# Patient Record
Sex: Female | Born: 1985 | Race: Black or African American | Hispanic: No | Marital: Married | Smoking: Never smoker
Health system: Southern US, Community
[De-identification: ages and names within clinical notes are randomized; demographics above are authoritative.]

## PROBLEM LIST (undated history)

## (undated) DIAGNOSIS — E559 Vitamin D deficiency, unspecified: Secondary | ICD-10-CM

## (undated) DIAGNOSIS — R519 Headache, unspecified: Secondary | ICD-10-CM

## (undated) DIAGNOSIS — R51 Headache: Secondary | ICD-10-CM

## (undated) DIAGNOSIS — T7840XA Allergy, unspecified, initial encounter: Secondary | ICD-10-CM

## (undated) HISTORY — DX: Vitamin D deficiency, unspecified: E55.9

## (undated) HISTORY — DX: Allergy, unspecified, initial encounter: T78.40XA

## (undated) HISTORY — PX: NO PAST SURGERIES: SHX2092

---

## 2010-05-28 ENCOUNTER — Ambulatory Visit: Payer: Self-pay | Admitting: Family Medicine

## 2010-06-03 ENCOUNTER — Ambulatory Visit: Payer: Self-pay | Admitting: Obstetrics & Gynecology

## 2010-06-03 ENCOUNTER — Ambulatory Visit (HOSPITAL_COMMUNITY): Admission: RE | Admit: 2010-06-03 | Discharge: 2010-06-03 | Payer: Self-pay | Admitting: Family Medicine

## 2010-06-03 LAB — CONVERTED CEMR LAB
Antibody Screen: NEGATIVE
Eosinophils Absolute: 0.1 10*3/uL (ref 0.0–0.7)
Eosinophils Relative: 1 % (ref 0–5)
GC Probe Amp, Genital: NEGATIVE
HCT: 35.1 % — ABNORMAL LOW (ref 36.0–46.0)
Hemoglobin: 11.3 g/dL — ABNORMAL LOW (ref 12.0–15.0)
Hepatitis B Surface Ag: NEGATIVE
Hgb A2 Quant: 3 % (ref 2.2–3.2)
Hgb A: 97 % (ref 96.8–97.8)
Lymphocytes Relative: 15 % (ref 12–46)
Lymphs Abs: 1.3 10*3/uL (ref 0.7–4.0)
MCV: 93.9 fL (ref 78.0–100.0)
Monocytes Relative: 3 % (ref 3–12)
Pap Smear: NEGATIVE
Platelets: 203 10*3/uL (ref 150–400)
RBC: 3.74 M/uL — ABNORMAL LOW (ref 3.87–5.11)
Rh Type: POSITIVE
WBC: 8.7 10*3/uL (ref 4.0–10.5)

## 2010-06-10 ENCOUNTER — Ambulatory Visit: Payer: Self-pay | Admitting: Family Medicine

## 2010-06-19 ENCOUNTER — Ambulatory Visit: Payer: Self-pay | Admitting: Obstetrics and Gynecology

## 2010-06-26 ENCOUNTER — Ambulatory Visit: Payer: Self-pay | Admitting: Obstetrics and Gynecology

## 2010-06-26 LAB — CONVERTED CEMR LAB: Chlamydia, Swab/Urine, PCR: NEGATIVE

## 2010-07-09 ENCOUNTER — Inpatient Hospital Stay (HOSPITAL_COMMUNITY)
Admission: AD | Admit: 2010-07-09 | Discharge: 2010-07-09 | Payer: Self-pay | Source: Home / Self Care | Admitting: Obstetrics & Gynecology

## 2010-07-09 DIAGNOSIS — O9989 Other specified diseases and conditions complicating pregnancy, childbirth and the puerperium: Secondary | ICD-10-CM

## 2010-07-09 DIAGNOSIS — J329 Chronic sinusitis, unspecified: Secondary | ICD-10-CM

## 2010-07-10 ENCOUNTER — Ambulatory Visit: Payer: Self-pay | Admitting: Obstetrics and Gynecology

## 2010-07-17 ENCOUNTER — Ambulatory Visit: Payer: Self-pay | Admitting: Obstetrics and Gynecology

## 2010-07-24 ENCOUNTER — Ambulatory Visit: Payer: Self-pay | Admitting: Obstetrics and Gynecology

## 2010-07-26 ENCOUNTER — Ambulatory Visit: Payer: Self-pay | Admitting: Obstetrics and Gynecology

## 2010-07-29 ENCOUNTER — Ambulatory Visit: Payer: Self-pay | Admitting: Obstetrics and Gynecology

## 2010-07-31 ENCOUNTER — Encounter: Payer: Self-pay | Admitting: Physician Assistant

## 2010-07-31 ENCOUNTER — Ambulatory Visit: Payer: Self-pay | Admitting: Obstetrics and Gynecology

## 2010-08-02 ENCOUNTER — Ambulatory Visit (HOSPITAL_COMMUNITY)
Admission: RE | Admit: 2010-08-02 | Discharge: 2010-08-02 | Payer: Self-pay | Source: Home / Self Care | Attending: Obstetrics and Gynecology | Admitting: Obstetrics and Gynecology

## 2010-08-04 ENCOUNTER — Inpatient Hospital Stay (HOSPITAL_COMMUNITY)
Admission: AD | Admit: 2010-08-04 | Discharge: 2010-08-04 | Payer: Self-pay | Source: Home / Self Care | Attending: Obstetrics and Gynecology | Admitting: Obstetrics and Gynecology

## 2010-08-07 ENCOUNTER — Inpatient Hospital Stay (HOSPITAL_COMMUNITY)
Admission: AD | Admit: 2010-08-07 | Discharge: 2010-08-10 | Payer: Self-pay | Source: Home / Self Care | Attending: Obstetrics and Gynecology | Admitting: Obstetrics and Gynecology

## 2010-08-07 ENCOUNTER — Ambulatory Visit: Payer: Self-pay | Admitting: Obstetrics and Gynecology

## 2010-08-07 ENCOUNTER — Ambulatory Visit: Payer: Self-pay | Admitting: Obstetrics & Gynecology

## 2010-09-05 ENCOUNTER — Encounter: Payer: Self-pay | Admitting: Obstetrics and Gynecology

## 2010-09-05 ENCOUNTER — Ambulatory Visit
Admission: RE | Admit: 2010-09-05 | Discharge: 2010-09-05 | Payer: Self-pay | Source: Home / Self Care | Attending: Obstetrics and Gynecology | Admitting: Obstetrics and Gynecology

## 2010-10-21 LAB — RPR: RPR Ser Ql: NONREACTIVE

## 2010-10-21 LAB — POCT URINALYSIS DIPSTICK
Bilirubin Urine: NEGATIVE
Glucose, UA: NEGATIVE mg/dL
Hgb urine dipstick: NEGATIVE
Ketones, ur: NEGATIVE mg/dL
Nitrite: NEGATIVE

## 2010-10-21 LAB — CBC
HCT: 38 % (ref 36.0–46.0)
Hemoglobin: 12.8 g/dL (ref 12.0–15.0)
RBC: 4.3 MIL/uL (ref 3.87–5.11)

## 2010-10-22 LAB — URINALYSIS, ROUTINE W REFLEX MICROSCOPIC
Glucose, UA: NEGATIVE mg/dL
Ketones, ur: NEGATIVE mg/dL
pH: 7.5 (ref 5.0–8.0)

## 2010-10-22 LAB — POCT URINALYSIS DIPSTICK
Bilirubin Urine: NEGATIVE
Bilirubin Urine: NEGATIVE
Glucose, UA: NEGATIVE mg/dL
Glucose, UA: NEGATIVE mg/dL
Glucose, UA: NEGATIVE mg/dL
Hgb urine dipstick: NEGATIVE
Hgb urine dipstick: NEGATIVE
Hgb urine dipstick: NEGATIVE
Hgb urine dipstick: NEGATIVE
Ketones, ur: NEGATIVE mg/dL
Ketones, ur: NEGATIVE mg/dL
Ketones, ur: NEGATIVE mg/dL
Ketones, ur: NEGATIVE mg/dL
Nitrite: NEGATIVE
Protein, ur: NEGATIVE mg/dL
Specific Gravity, Urine: 1.015 (ref 1.005–1.030)
Specific Gravity, Urine: 1.02 (ref 1.005–1.030)
Specific Gravity, Urine: 1.02 (ref 1.005–1.030)
Specific Gravity, Urine: 1.025 (ref 1.005–1.030)
Urobilinogen, UA: 0.2 mg/dL (ref 0.0–1.0)
pH: 7 (ref 5.0–8.0)
pH: 7 (ref 5.0–8.0)

## 2010-10-22 LAB — GC/CHLAMYDIA PROBE AMP, GENITAL: GC Probe Amp, Genital: NEGATIVE

## 2010-10-22 LAB — WET PREP, GENITAL
Trich, Wet Prep: NONE SEEN
Yeast Wet Prep HPF POC: NONE SEEN

## 2010-10-23 LAB — POCT URINALYSIS DIPSTICK
Nitrite: NEGATIVE
Protein, ur: NEGATIVE mg/dL
Protein, ur: NEGATIVE mg/dL
Urobilinogen, UA: 0.2 mg/dL (ref 0.0–1.0)
Urobilinogen, UA: 0.2 mg/dL (ref 0.0–1.0)
pH: 6.5 (ref 5.0–8.0)

## 2010-10-28 ENCOUNTER — Ambulatory Visit: Payer: Self-pay

## 2010-10-30 ENCOUNTER — Ambulatory Visit (INDEPENDENT_AMBULATORY_CARE_PROVIDER_SITE_OTHER): Payer: Medicaid Other

## 2010-10-30 DIAGNOSIS — Z3049 Encounter for surveillance of other contraceptives: Secondary | ICD-10-CM

## 2011-01-15 ENCOUNTER — Ambulatory Visit: Payer: Medicaid Other

## 2011-02-19 NOTE — Progress Notes (Signed)
Wanda Norton, BACHMANN NO.:  [id number]  MEDICAL RECORD NO.:  [id number]           PATIENT TYPE:  LOCATION:  [facility]                     FACILITY:  PHYSICIAN:  Caren Griffins, CNM       DATE OF BIRTH:  [birth year]  DATE OF SERVICE:  09/05/2010                                 CLINIC NOTE  REASON FOR VISIT:  Postpartum check.  HISTORY:  This is a 25 year old G2, P2-0-0-2 who is now 4 weeks post NSVD of a female weighing 8 pounds 5 ounces with no complications other than fourth-degree laceration.  She is doing well and has no concerns. She does not know whether she got the Depo-Provera shot, which was her preferred method of contraception.  She is breast-feeding without difficulty.  The baby is thriving and she has an appointment with the pediatrician January 31.  She has had intercourse without problems, there is no pain from the first-degree laceration and repair.  She denies depression and in fact has a score of only 1 on Edinburgh postpartum depression scale.  She would like to follow up here for the Depo-Provera.  Of note, the language line is used since she only speaks Arabic.  OBJECTIVE:  VITAL SIGNS:  Temperature 97.7, pulse 73, BP 104/66, weight 144.3, height 5 feet 3 inches. GENERAL:  WN/WD in NAD. NECK:  Thyroid not enlarged, smooth. LUNGS:  CTA bilateral. HEART:  RRR without murmur. BREASTS:  Lactational, not inflamed. ABDOMEN:  Soft, flat, nontender, small diastasis. PELVIC:  NEFG.  There is about 2 mm length of suture visible.  The first grade midline laceration is healed though.  There is a small amount of dark brown blood.  Cervix is long, closed, high.  Uterus is well involuted nontender.  No adnexal tenderness or masses. EXTREMITIES:  Without edema.  ASSESSMENT AND PLAN:  Normal 4 weeks postpartum status post Depo- Provera.  I explained she may have some abnormal bleeding or spotting and that she is protected from pregnancy.  She is given an  appointment to return here on November 10, 2010, for her next Depo-Provera shot, and she will need a Pap in October 2012.          ______________________________ Caren Griffins, CNM    DP/MEDQ  D:  09/05/2010  T:  09/06/2010  Job:  [phone]

## 2011-03-19 ENCOUNTER — Ambulatory Visit: Payer: Medicaid Other | Admitting: Family

## 2011-03-20 ENCOUNTER — Ambulatory Visit (INDEPENDENT_AMBULATORY_CARE_PROVIDER_SITE_OTHER): Payer: Medicaid Other | Admitting: Family Medicine

## 2011-03-20 VITALS — BP 103/69 | HR 69 | Wt 158.0 lb

## 2011-03-20 DIAGNOSIS — Z3049 Encounter for surveillance of other contraceptives: Secondary | ICD-10-CM

## 2011-03-20 DIAGNOSIS — Z309 Encounter for contraceptive management, unspecified: Secondary | ICD-10-CM

## 2011-03-20 LAB — POCT PREGNANCY, URINE: Preg Test, Ur: NEGATIVE

## 2011-03-20 MED ORDER — MEDROXYPROGESTERONE ACETATE 150 MG/ML IM SUSP
150.0000 mg | Freq: Once | INTRAMUSCULAR | Status: AC
  Administered 2011-03-20: 150 mg via INTRAMUSCULAR

## 2011-03-20 NOTE — Progress Notes (Signed)
Pt with sparse depo contraception.  Pt desires to resume depo with her last depo being more than 90 days ago.  Will check UPT.  She has intermittent menses so is unsure of LMP.  If UPT neg, will give depo. Case discussed with RN.    Reda Citron 2:20 PM 03/20/2011

## 2011-08-07 ENCOUNTER — Ambulatory Visit (INDEPENDENT_AMBULATORY_CARE_PROVIDER_SITE_OTHER): Payer: Medicaid Other | Admitting: Family Medicine

## 2011-08-07 VITALS — BP 100/64 | HR 81 | Temp 98.0°F | Ht 63.0 in | Wt 150.2 lb

## 2011-08-07 DIAGNOSIS — Z309 Encounter for contraceptive management, unspecified: Secondary | ICD-10-CM | POA: Insufficient documentation

## 2011-08-07 DIAGNOSIS — Z3049 Encounter for surveillance of other contraceptives: Secondary | ICD-10-CM

## 2011-08-07 DIAGNOSIS — Z23 Encounter for immunization: Secondary | ICD-10-CM

## 2011-08-07 MED ORDER — MEDROXYPROGESTERONE ACETATE 150 MG/ML IM SUSP
150.0000 mg | Freq: Once | INTRAMUSCULAR | Status: AC
  Administered 2011-08-07: 150 mg via INTRAMUSCULAR

## 2011-08-07 MED ORDER — MEDROXYPROGESTERONE ACETATE 150 MG/ML IM SUSP: 150.0000 mg | INTRAMUSCULAR | Status: DC

## 2011-08-07 MED ORDER — INFLUENZA VIRUS VACC SPLIT PF IM SUSP
0.5000 mL | Freq: Once | INTRAMUSCULAR | Status: AC
  Administered 2011-08-07: 0.5 mL via INTRAMUSCULAR

## 2011-08-07 NOTE — Progress Notes (Signed)
  Subjective:    Patient ID: Wanda Norton, female    DOB: 05-Apr-1986, 25 y.o.   MRN: [id number]  HPI Patient seen for contraception management.  LMP 12/20.  Normal cycles.  Has used depo in past.  Would like to do something longer.  Denies dysmenorrhea, pelvic pain.     Review of Systems     Objective:   Physical Exam  Constitutional: She appears well-developed and well-nourished.  Psychiatric: She has a normal mood and affect. Her behavior is normal. Judgment and thought content normal.      Assessment & Plan:  1.  Contraception management.   Urine pregnancy test done.  Depo provera shot.   Discussed mirena IUD. Patient would like to have this inserted.  Will schedule patient 2 months for IUD placement.

## 2011-10-08 ENCOUNTER — Encounter: Payer: Self-pay | Admitting: Obstetrics & Gynecology

## 2011-10-08 ENCOUNTER — Ambulatory Visit (INDEPENDENT_AMBULATORY_CARE_PROVIDER_SITE_OTHER): Payer: Medicaid Other | Admitting: Obstetrics & Gynecology

## 2011-10-08 VITALS — BP 100/62 | HR 85 | Temp 98.3°F | Resp 16 | Ht 62.5 in | Wt 148.2 lb

## 2011-10-08 DIAGNOSIS — Z01812 Encounter for preprocedural laboratory examination: Secondary | ICD-10-CM

## 2011-10-08 DIAGNOSIS — Z30430 Encounter for insertion of intrauterine contraceptive device: Secondary | ICD-10-CM | POA: Insufficient documentation

## 2011-10-08 DIAGNOSIS — Z309 Encounter for contraceptive management, unspecified: Secondary | ICD-10-CM

## 2011-10-08 MED ORDER — LEVONORGESTREL 20 MCG/24HR IU IUD
INTRAUTERINE_SYSTEM | Freq: Once | INTRAUTERINE | Status: AC
  Administered 2011-10-08: 1 via INTRAUTERINE

## 2011-10-08 NOTE — Patient Instructions (Signed)
Intrauterine Device Insertion Care After Refer to this sheet in the next few weeks. These instructions provide you with information on caring for yourself after your procedure. Your caregiver may also give you more specific instructions. Your treatment has been planned according to current medical practices, but problems sometimes occur. Call your caregiver if you have any problems or questions after your procedure. HOME CARE INSTRUCTIONS   Only take over-the-counter or prescription medicines for pain, discomfort, or fever as directed by your caregiver. Do not use aspirin. This may increase bleeding.   Check your IUD to make sure it is in place before you resume sexual activity. You should be able to feel the strings. If you cannot feel the strings, something may be wrong. The IUD may have fallen out of the uterus, or the uterus may have been punctured (perforated) during placement. Also, if the strings are getting longer, it may mean that the IUD is being forced out of the uterus. You no longer have full protection from pregnancy if any of these problems occur.   You may resume sexual intercourse if you are not having problems with the IUD. The IUD is considered immediately effective.   You may resume normal activities.   Keep all follow-up appointments to be sure your IUD has remained in place. After the first exam, yearly exams are advised, unless you cannot feel the strings of your IUD.   Continue to check that the IUD is still in place by feeling for the strings after every menstrual period.  SEEK MEDICAL CARE IF:   You have bleeding that is heavier or lasts longer than a normal menstrual cycle.   You have a fever.   You have increasing cramps or abdominal pain not relieved with medicine.   You have abdominal pain that does not seem to be related to the same area of earlier cramping and pain.   You are lightheaded, unusually weak, or faint.   You have abnormal vaginal discharge or  smells.   You have pain during sexual intercourse.   You cannot feel the IUD strings, or the IUD string has gotten longer.   You feel the IUD at the opening of the cervix in the vagina.   You think you are pregnant, or you miss your menstrual period.   The IUD string is hurting your sex partner.  Document Released: 03/26/2011 Document Revised: 04/09/2011 Document Reviewed: 03/26/2011 [employer] Patient Information 2012 [employer]. 

## 2011-10-08 NOTE — Progress Notes (Signed)
  Subjective:    Patient ID: Wanda Norton, female    DOB: November 19, 1985, 26 y.o.   MRN: [id number]  HPI [id number] Patient's last menstrual period was 07/31/2011. Patient is scheduled for insertion of Mirena today. She was counseled at her last visit and she received a Depo-Provera injection. Using the interpreter over the phone we discussed the methods of contraception the risk of failure the risk of bleeding infection pain and uterine damage. She signed consent. Her questions were answered. History reviewed. No pertinent past medical history. History reviewed. No pertinent past surgical history. Current outpatient prescriptions:ibuprofen (ADVIL) 200 MG tablet, Take 200 mg by mouth every 6 (six) hours as needed.  , Disp: , Rfl:  No Known Allergies History   Social History  . Marital Status: Married    Spouse Name: N/A    Number of Children: N/A  . Years of Education: N/A   Occupational History  . Not on file.   Social History Main Topics  . Smoking status: Never Smoker   . Smokeless tobacco: Never Used  . Alcohol Use: No  . Drug Use: No  . Sexually Active: Yes    Birth Control/ Protection: None, Injection   Other Topics Concern  . Not on file   Social History Narrative  . No narrative on file      Review of Systems No bleeding or abnormal discharge.    Objective:   Physical Exam Filed Vitals:   10/08/11 1437  BP: 100/62  Pulse: 85  Temp: 98.3 F (36.8 C)  TempSrc: Oral  Resp: 16  Height: 5' 2.5" (1.588 m)  Weight: 148 lb 3.2 oz (67.223 kg)   No acute distress normal affect Pelvic: External genitalia vagina and cervix appeared normal. Cervix was prepped with Betadine. Uterus sounded to 7 cm. The Mirena was placed in the usual fashion with no difficulty. The string was trimmed to about 3 cm. Instruments were removed. She tolerated this well without problems.       Assessment & Plan:  Mirena insertion without complications. She will notify us if she has problems  obtained abnormal bleeding abnormal discharge or fever. She'll return in 4 weeks for string check.  Dr. Scheryl Darter 10/08/2011

## 2011-10-08 NOTE — Progress Notes (Signed)
UPT = negative   Pt informed of IUD placement procedure and had no questions. Pt states she was informed of risks @ last visit.  [employer] interpreter # [phone] used during nurse assessment.

## 2011-10-29 ENCOUNTER — Encounter: Payer: Self-pay | Admitting: Advanced Practice Midwife

## 2011-10-29 ENCOUNTER — Ambulatory Visit (INDEPENDENT_AMBULATORY_CARE_PROVIDER_SITE_OTHER): Payer: Medicaid Other | Admitting: Advanced Practice Midwife

## 2011-10-29 VITALS — BP 95/63 | HR 65 | Temp 97.2°F | Ht 62.0 in | Wt 144.7 lb

## 2011-10-29 DIAGNOSIS — Z30431 Encounter for routine checking of intrauterine contraceptive device: Secondary | ICD-10-CM

## 2011-10-29 DIAGNOSIS — Z30430 Encounter for insertion of intrauterine contraceptive device: Secondary | ICD-10-CM

## 2011-10-29 NOTE — Progress Notes (Signed)
  Subjective:    Patient ID: Wanda Norton, female    DOB: [birth year], 26 y.o.   MRN: [id number]  HPI This is a 26 y.o. who presents for string check. She had a Mirena IUD inserted 10/08/11 without incident. She has had no problems except for some mild back pain, lower.    Review of Systems As above    Objective:   Physical Exam Speculum exam:  Normal ext. Genitalia                              Vagina clear                               Cervix closed, IUD string visible.        Assessment & Plan:  Reassured patient Continue normal activities at home.  Return to office PRN

## 2011-10-29 NOTE — Patient Instructions (Signed)

## 2011-12-15 ENCOUNTER — Encounter (HOSPITAL_COMMUNITY): Payer: Self-pay | Admitting: *Deleted

## 2011-12-15 ENCOUNTER — Emergency Department (HOSPITAL_COMMUNITY): Payer: Medicaid Other

## 2011-12-15 ENCOUNTER — Emergency Department (HOSPITAL_COMMUNITY)
Admission: EM | Admit: 2011-12-15 | Discharge: 2011-12-16 | Disposition: A | Discharge status: Home / Self Care | Payer: Medicaid Other | Attending: Emergency Medicine | Admitting: Emergency Medicine

## 2011-12-15 DIAGNOSIS — R1013 Epigastric pain: Secondary | ICD-10-CM | POA: Insufficient documentation

## 2011-12-15 DIAGNOSIS — R799 Abnormal finding of blood chemistry, unspecified: Secondary | ICD-10-CM | POA: Insufficient documentation

## 2011-12-15 DIAGNOSIS — R9431 Abnormal electrocardiogram [ECG] [EKG]: Secondary | ICD-10-CM | POA: Insufficient documentation

## 2011-12-15 DIAGNOSIS — R079 Chest pain, unspecified: Secondary | ICD-10-CM | POA: Insufficient documentation

## 2011-12-15 DIAGNOSIS — R0602 Shortness of breath: Secondary | ICD-10-CM | POA: Insufficient documentation

## 2011-12-15 DIAGNOSIS — R002 Palpitations: Secondary | ICD-10-CM | POA: Insufficient documentation

## 2011-12-15 LAB — CBC
Hemoglobin: 13 g/dL (ref 12.0–15.0)
MCHC: 33.4 g/dL (ref 30.0–36.0)
Platelets: 247 10*3/uL (ref 150–400)
RBC: 4.55 MIL/uL (ref 3.87–5.11)

## 2011-12-15 LAB — BASIC METABOLIC PANEL
CO2: 23 mEq/L (ref 19–32)
GFR calc non Af Amer: >90 mL/min (ref >90)
Glucose, Bld: 111 mg/dL — ABNORMAL HIGH (ref 70–99)
Potassium: 3.4 mEq/L — ABNORMAL LOW (ref 3.5–5.1)
Sodium: 142 mEq/L (ref 135–145)

## 2011-12-15 LAB — D-DIMER, QUANTITATIVE: D-Dimer, Quant: 0.53 ug/mL-FEU — ABNORMAL HIGH (ref 0.00–0.48)

## 2011-12-15 LAB — PRO B NATRIURETIC PEPTIDE: Pro B Natriuretic peptide (BNP): 22 pg/mL (ref 0–125)

## 2011-12-15 MED ORDER — IOHEXOL 350 MG/ML SOLN
80.0000 mL | Freq: Once | INTRAVENOUS | Status: AC | PRN
  Administered 2011-12-15: 80 mL via INTRAVENOUS

## 2011-12-15 MED ORDER — ASPIRIN 325 MG PO TABS
325.0000 mg | ORAL_TABLET | ORAL | Status: AC
  Administered 2011-12-15: 325 mg via ORAL
  Filled 2011-12-15: qty 1

## 2011-12-15 MED ORDER — NITROGLYCERIN 0.4 MG SL SUBL: 0.4000 mg | SUBLINGUAL_TABLET | SUBLINGUAL | Status: DC | PRN

## 2011-12-15 NOTE — ED Notes (Signed)
Pt with epigastric pain that began yesterday.  Pt states that she has been feeling tired and had some sob with this.  Pt could not sleep due to symptoms and she states that she has been having a lot of stress

## 2011-12-15 NOTE — ED Notes (Signed)
Patient denies pain and is resting comfortably.  

## 2011-12-15 NOTE — ED Notes (Signed)
Pt is resting comfortably and reports she is chest pain free.

## 2011-12-15 NOTE — Discharge Instructions (Signed)
You will need to follow up with cardiology for evaluation for possible Wolff-Parkinson-White syndrome and possible Holter monitor.  Palpitations  A palpitation is the feeling that your heartbeat is irregular or is faster than normal. Although this is frightening, it usually is not serious. Palpitations may be caused by excesses of smoking, caffeine, or alcohol. They are also brought on by stress and anxiety. Sometimes, they are caused by heart disease. Unless otherwise noted, your caregiver did not find any signs of serious illness at this time. HOME CARE INSTRUCTIONS  To help prevent palpitations:  Drink decaffeinated coffee, tea, and soda pop. Avoid chocolate.   If you smoke or drink alcohol, quit or cut down as much as possible.   Reduce your stress or anxiety level. Biofeedback, yoga, or meditation will help you relax. Physical activity such as swimming, jogging, or walking also may be helpful.  SEEK MEDICAL CARE IF:   You continue to have a fast heartbeat.   Your palpitations occur more often.  SEEK IMMEDIATE MEDICAL CARE IF: You develop chest pain, shortness of breath, severe headache, dizziness, or fainting. Document Released: 07/25/2000 Document Revised: 07/17/2011 Document Reviewed: 09/24/2007 [facility] Patient Information 2012 [place], [place].  Wolff-Parkinson-White Syndrome Wolff-Parkinson-White Syndrome (WPW) is an abnormal heart condition that can cause the heart to beat very fast. CAUSES  In WPW syndrome, an extra electrical connection (pathway) exists between the top chambers of your heart (atria) and the bottom chambers of your heart (ventricles). This is known as an extra (accessory) pathway. This extra pathway can cause the heart to short circuit and beat very fast. SYMPTOMS  Symptoms in WPW syndrome can vary. These include:  Feeling your heart "skip" beats (palpitations).   Dizziness.   Fainting or near fainting.   Sudden death.  DIAGNOSIS  WPW can be diagnosed  by different test such as:  ECG (electrocardiogram).   Echocardiogram.   Holter monitoring.   Stress testing.   Electrophysiology study.   Laboratory tests that check certain blood levels.   Thyroid study.  TREATMENT  WPW is usually treated in two ways:  Radiofrequency destruction (ablation). In this procedure, a thin, flexible tube (catheter) is placed in the heart through a vein in the upper leg (groin). The catheter is guided to the extra pathway. The extra pathway is destroyed by using high frequency radio waves.   Medicine can sometimes be used to treat WPW.  SEEK IMMEDIATE MEDICAL CARE IF:   You feel palpitations that are frequent or continual.   You develop chest pain and also have:   Shortness of breath or difficulty breathing.   Nausea and vomiting.   Sweating.   You become light-headed or faint (pass out).  MAKE SURE YOU:   Understand these instructions.   Will watch your condition.   Will get help right away if you are not doing well or get worse.  Document Released: 10/18/2003 Document Revised: 07/17/2011 Document Reviewed: 10/14/2008 [facility] Patient Information 2012 [place], [place].   RESOURCE GUIDE  Dental Problems  Patients with Medicaid: [facility] [address]                                           [phone] [address] [employer] [address] Phone:  [phone]  Phone:  [phone]  If unable to pay or uninsured, contact:  [facility] or [facility]. to become qualified for the adult dental clinic.  Chronic Pain Problems Contact [facility]  [phone] Patients need to be referred by their primary care doctor.  Insufficient Money for Medicine Contact [employer]:  call "211" or [facility] [phone].  No Primary Care Doctor Call [facility]  [phone] Other agencies that provide inexpensive medical care     [facility]  [phone]    [facility] Internal Medicine  [phone]    [facility]  [phone]    [facility]  [phone]    [facility]  [phone]    [facility]  [phone]  Psychological Services [facility] Behavioral Health  [phone] [facility]  [phone] [facility] Mental Health   [phone] (emergency services [phone])  Abuse/Neglect [facility] Child Abuse Hotline (559)723-4851 [facility] Child Abuse Hotline (878)527-4838 (After Hours)  Emergency Shelter [facility] 5813191070  Maternity Homes [facility] at the [place] [facility] [place] [phone] [place] Alert Services 5091902197  MRSA Hotline #:   (506)824-4957    [facility] Resources  [facility]  [employer]                           [facility] Dept. [address] [place]                     [address]         [address]  Blondell Reveal Phone:  [phone]                                  Phone:  [phone]                   Phone:  [phone]  [facility] Mental Health Phone:  [phone]  [facility] Child Abuse Hotline [phone]) 575-9640 [phone] (After Hours)

## 2011-12-16 NOTE — ED Provider Notes (Addendum)
History     CSN: [id number]  Arrival date & time 12/15/11  1738   First MD Initiated Contact with Patient 12/15/11 2011      Chief Complaint  Patient presents with  . Shortness of Breath  . Palpitations    (Consider location/radiation/quality/duration/timing/severity/associated sxs/prior treatment) HPI  Language interpreter used for translation.   26yoF is a healthy presents with shortness of breath and palpitations. The patient states that since yesterday approximately 1 AM she began to intermittent palpitations with mild shortness of breath. She's not having shortness of breath at this time. She states that yesterday she had minimal epigastric pain but denies chest pain. Shortness of breath and palpitations were not worse with exertion. Denies h/o VTE in self or family. No recent hosp/surg/immob. No h/o cancer. Denies exogenous hormone use, no leg pain or swelling. No h/o similar. She does state that she had some burning in her feet with palpitations as well. She denies numbness or tingling in her fingers. She does state that she's had recent increase in social stressors. No history of anxiety in the past. No syncope. Denies fever/chills/recent illness.   ED Notes, ED Provider Notes from 12/15/11 0000 to 12/15/11 19:18:55       Threasa Alpha Flueckiger, RN 12/15/2011 17:46      Pt with epigastric pain that began yesterday. Pt states that she has been feeling tired and had some sob with this. Pt could not sleep due to symptoms and she states that she has been having a lot of stress     History reviewed. No pertinent past medical history.  History reviewed. No pertinent past surgical history.  No family history on file.  History  Substance Use Topics  . Smoking status: Never Smoker   . Smokeless tobacco: Never Used  . Alcohol Use: No    OB History    Grav Para Term Preterm Abortions TAB SAB Ect Mult Living   2 2 2       2       Review of Systems  All other systems reviewed  and are negative.   except as noted HPI  Allergies  Review of patient's allergies indicates no known allergies.  Home Medications   Current Outpatient Rx  Name Route Sig Dispense Refill  . IBUPROFEN 200 MG PO TABS Oral Take 200 mg by mouth every 6 (six) hours as needed. FOR PAIN      BP 96/50  Pulse 69  Temp(Src) 98.7 F (37.1 C) (Oral)  Resp 16  SpO2 98%  LMP 12/14/2011  Physical Exam  Nursing note and vitals reviewed. Constitutional: She is oriented to person, place, and time. She appears well-developed.  HENT:  Head: Atraumatic.  Mouth/Throat: Oropharynx is clear and moist.  Eyes: Conjunctivae and EOM are normal. Pupils are equal, round, and reactive to light.  Neck: Normal range of motion. Neck supple.  Cardiovascular: Normal rate, regular rhythm, normal heart sounds and intact distal pulses.   Pulmonary/Chest: Effort normal and breath sounds normal. No respiratory distress. She has no wheezes. She has no rales.  Abdominal: Soft. She exhibits no distension. There is no tenderness. There is no rebound and no guarding.  Musculoskeletal: Normal range of motion. She exhibits no edema and no tenderness.  Neurological: She is alert and oriented to person, place, and time.  Skin: Skin is warm and dry. No rash noted.  Psychiatric: She has a normal mood and affect.    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  CHEST - 2 VIEW  Comparison: None.  Findings:  Cardiopericardial silhouette within normal limits. Mediastinal contours normal. Trachea midline.  No airspace disease or effusion.  IMPRESSION: No active cardiopulmonary disease.  Original Report Authenticated By: Andreas Newport, M.D.   Ct Angio Chest W/cm &/or Wo Cm  12/15/2011  *RADIOLOGY REPORT*  Clinical Data: Chest pressure and shortness of breath; elevated D- dimer.  CT ANGIOGRAPHY CHEST  Technique:  Multidetector CT imaging of the chest using the standard protocol during bolus administration of intravenous contrast. Multiplanar reconstructed images including MIPs were obtained and reviewed to evaluate the vascular anatomy.  Contrast: 80mL OMNIPAQUE IOHEXOL 350 MG/ML SOLN  Comparison: Chest radiograph performed earlier today at 06:25 p.m.  Findings: There is no evidence of significant pulmonary embolus.  The lungs are essentially clear bilaterally.  There is no evidence of significant focal consolidation, pleural effusion or pneumothorax.  No masses are identified; no abnormal focal contrast enhancement is seen.  The mediastinum is unremarkable in appearance.  There is no evidence of mediastinal lymphadenopathy.  No pericardial effusion is seen.  The great vessels are unremarkable in appearance.  No axillary lymphadenopathy is seen.  The visualized portions of the thyroid gland are unremarkable in appearance.  The visualized portions of the liver and spleen are unremarkable.  No acute osseous abnormalities are seen.  IMPRESSION: No evidence of significant pulmonary embolus; unremarkable CTA of the chest.  Original Report Authenticated By: Tonia Ghent, M.D.   1. Shortness of breath   2. Palpitations   3. Shortened PR interval    MDM  26yoF previously healthy pw intermittent  palpitations. 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 She is not currently having arrhythmia. No syncope. Discussed briefly with cardiology on call. Appropriate for outpatient follow up. No new medications at this time. Discussed with patient and family who will f/u. Dimer positive, CTA negative for PE. I do not suspect infectious etiology, in fact, she does not complain of chest pain to me- endo/myo/pericarditis less likely. Given strict precautions for return.    BP 108/61  Pulse 68  Temp(Src) 98.7 F (37.1 C) (Oral)  Resp 16  SpO2 99%  LMP 12/14/2011     Forbes Cellar, MD 12/16/11 [phone]

## 2013-12-01 ENCOUNTER — Ambulatory Visit (INDEPENDENT_AMBULATORY_CARE_PROVIDER_SITE_OTHER): Payer: No Typology Code available for payment source | Admitting: Obstetrics & Gynecology

## 2013-12-01 ENCOUNTER — Encounter: Payer: Self-pay | Admitting: Obstetrics & Gynecology

## 2013-12-01 VITALS — BP 113/73 | HR 76 | Temp 98.0°F | Ht 62.0 in | Wt 152.8 lb

## 2013-12-01 DIAGNOSIS — Z30432 Encounter for removal of intrauterine contraceptive device: Secondary | ICD-10-CM

## 2013-12-01 NOTE — Progress Notes (Signed)
Patient ID: Wanda Norton, female   DOB: 17-Sep-1985, 28 y.o.   MRN: [id number] The patient c/o acne and HA that is continuing after 2 1/2 years after an IUD so she is seeking to have it removed.  She reports that she will use [employer].  Patient was in the dorsal lithotomy position, normal external genitalia was noted.  A speculum was placed in the patient's vagina, normal discharge was noted, no lesions. The multiparous cervix was visualized, no lesions, no abnormal discharge;  and the cervix was swabbed with Betadine using scopettes. The strings of the IUD were grasped and pulled using ring forceps.  The IUD was successfully removed in its entirety. Patient tolerated the procedure well.

## 2013-12-01 NOTE — Patient Instructions (Signed)
Natural Family Planning  Natural Family Planning (NFP) is a type of birth control without using any form of contraception. Women who use NFP should not have sexual intercourse when the ovary produces an egg (ovulation) during the menstrual cycle. The NFP method is safe and can prevent pregnancy. It is 75% effective when practiced right. The man needs to also understand this method of birth control and the woman needs to be aware of how her body functions during her menstrual cycle. NFP can also be used as a method of getting pregnant.   HOW THE NFP METHOD WORKS  · A woman's menstrual period usually happens every 28 30 days (it can vary from 23 35 days).  · Ovulation happens 12 14 days before the start of the next menstrual period (the fertile period). The egg is fertile for 24 hours and the sperm can live for 3 days or more. If there is sexual intercourse at this time, pregnancy can occur.  THERE ARE MANY TYPES OF NFP METHODS USED TO PREVENT PREGNANCY  · The basal body temperature method Often times, there is a slight increase of body temperature when a woman ovulates. Take your temperature every morning before getting out of bed. Write the temperature on a chart. An increase in the temperature shows ovulation has happened. Do not have sexual intercourse from the menstrual period up to three days after the increase in the temperature. Note that the body temperature may increase as a result of fever, restless sleep, and working schedules.  · The ovulation cervical mucus method During the menstrual cycle, the cervical mucus changes from dry and sticky to wet and slippery. Check the mucus of the vagina every day to look for these changes. Just before ovulation, the mucus becomes wet and slippery. On the last day of wetness, ovulation happens. To avoid getting pregnant, sexual intercourse is safe for about 10 days after the menstrual period and on the dry mucus days. Do not have sexual intercourse when the mucus starts  to show up and not until 4 days after the wet and slippery mucus goes away. Sexual intercourse after the 4 days have passed until the menstrual period starts is a safe time. Note that the mucus from the vagina can increase because of a vaginal or cervical infection, lubricants, some medicines, and sexual excitement.  · The symptothermal method This method uses both the temperature and the ovulation methods. Combine the two methods above to prevent pregnancy.  · The calendar method Record your menstrual periods and length of the cycles for 6 months. This is helpful when the menstrual cycle varies in the length of the cycle. The length of a menstrual cycle is from day 1 of the present menstrual period to day 1 of the next menstrual period. Then, find your fertile days of the month and do not have sexual intercourse during that time. You may need help from your health care provider to find out your fertile days.  There are some signs of ovulation that may be helpful when trying to find the time of ovulation. This includes vaginal spotting or abdominal cramps during the middle of your menstrual cycle. Not all women have these symptoms.  YOU SHOULD NOT USE NFP IF:  · You have very irregular menstrual periods and may skip months.  · You have abnormal bleeding.  · You have a vaginal or cervical infection.  · You are on medicines that can affect the vaginal mucus or body temperature. These medicines include   antibiotics, thyroid medicines, and antihistamines (cold and allergy medicine).  Document Released: 01/14/2008 Document Revised: 03/30/2013 Document Reviewed: 01/28/2013  ExitCare® Patient Information ©2014 [employer].

## 2013-12-05 ENCOUNTER — Encounter: Payer: Self-pay | Admitting: *Deleted

## 2014-06-12 ENCOUNTER — Encounter: Payer: Self-pay | Admitting: Obstetrics & Gynecology

## 2014-06-30 ENCOUNTER — Emergency Department (HOSPITAL_COMMUNITY): Payer: No Typology Code available for payment source

## 2014-06-30 ENCOUNTER — Emergency Department (HOSPITAL_COMMUNITY)
Admission: EM | Admit: 2014-06-30 | Discharge: 2014-07-01 | Disposition: A | Discharge status: Home / Self Care | Payer: No Typology Code available for payment source | Attending: Emergency Medicine | Admitting: Emergency Medicine

## 2014-06-30 ENCOUNTER — Encounter (HOSPITAL_COMMUNITY): Payer: Self-pay | Admitting: Emergency Medicine

## 2014-06-30 DIAGNOSIS — R1110 Vomiting, unspecified: Secondary | ICD-10-CM

## 2014-06-30 DIAGNOSIS — Z791 Long term (current) use of non-steroidal anti-inflammatories (NSAID): Secondary | ICD-10-CM | POA: Insufficient documentation

## 2014-06-30 DIAGNOSIS — Z3202 Encounter for pregnancy test, result negative: Secondary | ICD-10-CM | POA: Insufficient documentation

## 2014-06-30 DIAGNOSIS — R112 Nausea with vomiting, unspecified: Secondary | ICD-10-CM | POA: Insufficient documentation

## 2014-06-30 LAB — CBC WITH DIFFERENTIAL/PLATELET
Basophils Absolute: 0 10*3/uL (ref 0.0–0.1)
Basophils Relative: 0 % (ref 0–1)
EOS ABS: 0.1 10*3/uL (ref 0.0–0.7)
EOS PCT: 1 % (ref 0–5)
HCT: 37.8 % (ref 36.0–46.0)
HEMOGLOBIN: 12.5 g/dL (ref 12.0–15.0)
LYMPHS ABS: 1.1 10*3/uL (ref 0.7–4.0)
Lymphocytes Relative: 14 % (ref 12–46)
MCH: 28.2 pg (ref 26.0–34.0)
MCHC: 33.1 g/dL (ref 30.0–36.0)
MCV: 85.1 fL (ref 78.0–100.0)
MONOS PCT: 4 % (ref 3–12)
Monocytes Absolute: 0.3 10*3/uL (ref 0.1–1.0)
Neutro Abs: 6.4 10*3/uL (ref 1.7–7.7)
Neutrophils Relative %: 81 % — ABNORMAL HIGH (ref 43–77)
PLATELETS: 219 10*3/uL (ref 150–400)
RBC: 4.44 MIL/uL (ref 3.87–5.11)
RDW: 13.2 % (ref 11.5–15.5)
WBC: 7.8 10*3/uL (ref 4.0–10.5)

## 2014-06-30 LAB — URINALYSIS, ROUTINE W REFLEX MICROSCOPIC
BILIRUBIN URINE: NEGATIVE
Glucose, UA: NEGATIVE mg/dL
Hgb urine dipstick: NEGATIVE
Ketones, ur: NEGATIVE mg/dL
NITRITE: NEGATIVE
Protein, ur: NEGATIVE mg/dL
SPECIFIC GRAVITY, URINE: 1.007 (ref 1.005–1.030)
UROBILINOGEN UA: 0.2 mg/dL (ref 0.0–1.0)
pH: 7.5 (ref 5.0–8.0)

## 2014-06-30 LAB — COMPREHENSIVE METABOLIC PANEL
ALT: 17 U/L (ref 0–35)
ANION GAP: 13 (ref 5–15)
AST: 24 U/L (ref 0–37)
Albumin: 3.9 g/dL (ref 3.5–5.2)
Alkaline Phosphatase: 62 U/L (ref 39–117)
BUN: 6 mg/dL (ref 6–23)
CALCIUM: 9.3 mg/dL (ref 8.4–10.5)
CO2: 23 mEq/L (ref 19–32)
CREATININE: 0.67 mg/dL (ref 0.50–1.10)
Chloride: 104 mEq/L (ref 96–112)
GFR calc non Af Amer: >90 mL/min (ref >90)
GLUCOSE: 95 mg/dL (ref 70–99)
Potassium: 3.8 mEq/L (ref 3.7–5.3)
Sodium: 140 mEq/L (ref 137–147)
TOTAL PROTEIN: 7.9 g/dL (ref 6.0–8.3)
Total Bilirubin: 0.4 mg/dL (ref 0.3–1.2)

## 2014-06-30 LAB — URINE MICROSCOPIC-ADD ON

## 2014-06-30 LAB — PREGNANCY, URINE: PREG TEST UR: NEGATIVE

## 2014-06-30 MED ORDER — SODIUM CHLORIDE 0.9 % IV BOLUS (SEPSIS)
1000.0000 mL | Freq: Once | INTRAVENOUS | Status: AC
  Administered 2014-06-30: 1000 mL via INTRAVENOUS

## 2014-06-30 MED ORDER — ONDANSETRON 4 MG PO TBDP
4.0000 mg | ORAL_TABLET | Freq: Once | ORAL | Status: AC
  Administered 2014-06-30: 4 mg via ORAL
  Filled 2014-06-30: qty 1

## 2014-06-30 MED ORDER — ONDANSETRON 4 MG PO TBDP: ORAL_TABLET | ORAL | Status: DC

## 2014-06-30 NOTE — Discharge Instructions (Signed)
If you were given medicines take as directed.  If you are on coumadin or contraceptives realize their levels and effectiveness is altered by many different medicines.  If you have any reaction (rash, tongues swelling, other) to the medicines stop taking and see a physician.   Please follow up as directed and return to the ER or see a physician for new or worsening symptoms.  Thank you. Filed Vitals:   06/30/14 1945 06/30/14 1950 06/30/14 2015 06/30/14 2045  BP: 100/64 100/64 101/61 100/63  Pulse: 60 63 59 62  Temp:  98 F (36.7 C)    TempSrc:  Oral    Resp: 11 13 16 18   Weight:      SpO2: 96% 99% 99% 99%

## 2014-06-30 NOTE — ED Notes (Signed)
Patient still unable to urinate for urinalysis

## 2014-06-30 NOTE — ED Notes (Signed)
Patient still unable to urinate  

## 2014-06-30 NOTE — ED Notes (Signed)
EKG performed at 19:43 and then given to Dr. Jodi MourningZavitz

## 2014-06-30 NOTE — ED Notes (Signed)
Patient transported to X-ray 

## 2014-06-30 NOTE — ED Notes (Signed)
Pt transported to CT ?

## 2014-06-30 NOTE — ED Notes (Signed)
Pt. reports nausea and vomitting ( x4) today , denies diarrhea , no fever or chills.

## 2014-06-30 NOTE — ED Provider Notes (Signed)
CSN: [id number]     Arrival date & time 06/30/14  1911 History   First MD Initiated Contact with Patient 06/30/14 1930     Chief Complaint  Patient presents with  . Emesis     (Consider location/radiation/quality/duration/timing/severity/associated sxs/prior Treatment) HPI Comments: 28 year old female with no significant medical history, nonsmoker, no alcohol presents with 4 episodes of nonbloody vomiting  Patient is a 28 y.o. female presenting with vomiting. The history is provided by the patient.  Emesis Associated symptoms: no abdominal pain, no chills, no diarrhea and no headaches     History reviewed. No pertinent past medical history. History reviewed. No pertinent past surgical history. No family history on file. History  Substance Use Topics  . Smoking status: Never Smoker   . Smokeless tobacco: Never Used  . Alcohol Use: No   OB History    Gravida Para Term Preterm AB TAB SAB Ectopic Multiple Living   2 2 2       2      Review of Systems  Constitutional: Negative for fever and chills.  HENT: Negative for congestion.   Eyes: Negative for visual disturbance.  Respiratory: Positive for shortness of breath (not currently, patient had transient episode this morning with brief right chest pain with a cough.).   Cardiovascular: Negative for chest pain.  Gastrointestinal: Positive for nausea and vomiting. Negative for abdominal pain and diarrhea.  Genitourinary: Negative for dysuria and flank pain.  Musculoskeletal: Negative for back pain, neck pain and neck stiffness.  Skin: Negative for rash.  Neurological: Negative for light-headedness and headaches.      Allergies  Review of patient's allergies indicates no known allergies.  Home Medications   Prior to Admission medications   Medication Sig Start Date End Date Taking? Authorizing Provider  ibuprofen (ADVIL) 200 MG tablet Take 200 mg by mouth every 6 (six) hours as needed. FOR PAIN   Yes Historical Provider,  MD  ondansetron (ZOFRAN ODT) 4 MG disintegrating tablet 4mg  ODT q4 hours prn nausea/vomit 06/30/14   Enid SkeensJoshua M Princesa Willig, MD   BP 100/63 mmHg  Pulse 62  Temp(Src) 98 F (36.7 C) (Oral)  Resp 18  Wt 156 lb (70.761 kg)  SpO2 99%  LMP 06/08/2014 Physical Exam  Constitutional: She is oriented to person, place, and time. She appears well-developed and well-nourished.  HENT:  Head: Normocephalic and atraumatic.  Eyes: Conjunctivae are normal. Right eye exhibits no discharge. Left eye exhibits no discharge.  Neck: Normal range of motion. Neck supple. No tracheal deviation present.  Cardiovascular: Normal rate and regular rhythm.   Pulmonary/Chest: Effort normal and breath sounds normal.  Abdominal: Soft. She exhibits no distension. There is no tenderness. There is no guarding.  Musculoskeletal: She exhibits no edema.  Neurological: She is alert and oriented to person, place, and time.  Skin: Skin is warm. No rash noted.  Psychiatric: She has a normal mood and affect.  Nursing note and vitals reviewed.   ED Course  Procedures (including critical care time) Labs Review Labs Reviewed  CBC WITH DIFFERENTIAL - Abnormal; Notable for the following:    Neutrophils Relative % 81 (*)    All other components within normal limits  COMPREHENSIVE METABOLIC PANEL  PREGNANCY, URINE  URINALYSIS, ROUTINE W REFLEX MICROSCOPIC    Imaging Review Dg Chest 2 View  06/30/2014   CLINICAL DATA:  Chest pain with shortness of breath. Vomiting today. Initial encounter.  EXAM: CHEST  2 VIEW  COMPARISON:  CT and radiographs 12/15/2011.  FINDINGS:  There are low lung volumes with mild vascular crowding at both lung bases. No confluent airspace opacity, edema or pleural effusion is present. The heart size and mediastinal contours are stable. The bones appear unremarkable.  IMPRESSION: No active cardiopulmonary process.   Electronically Signed   By: Roxy HorsemanBill  Veazey M.D.   On: 06/30/2014 21:13     EKG  Interpretation None      MDM   Final diagnoses:  Vomiting   Well-appearing healthy female with 4 episodes of vomiting and nausea today, no abdominal pain, no fevers patient had transient very mild shortness of breath earlier today that resolved. No concern for blood clot, normal vitals, benign abdominal exam. Urinalysis pending. Zofran and oral fluids.   Delay in urinalysis. Blood work unremarkable reviewed. Chest x-ray unremarkable reviewed. Urinalysis pending. Plan for outpatient follow-up well-appearing in ER.  The patients results and plan were reviewed and discussed.   Any x-rays performed were personally reviewed by myself.   Differential diagnosis were considered with the presenting HPI.  Medications  ondansetron (ZOFRAN-ODT) disintegrating tablet 4 mg (4 mg Oral Given 06/30/14 2049)  sodium chloride 0.9 % bolus 1,000 mL (1,000 mLs Intravenous New Bag/Given 06/30/14 2119)    Filed Vitals:   06/30/14 1945 06/30/14 1950 06/30/14 2015 06/30/14 2045  BP: 100/64 100/64 101/61 100/63  Pulse: 60 63 59 62  Temp:  98 F (36.7 C)    TempSrc:  Oral    Resp: 11 13 16 18   Weight:      SpO2: 96% 99% 99% 99%    Final diagnoses:  Vomiting    Admission/ observation were discussed with the admitting physician, patient and/or family and they are comfortable with the plan. ,  Enid SkeensJoshua M Andrienne Havener, MD 06/30/14 2303

## 2014-06-30 NOTE — ED Notes (Addendum)
Pt reports vomitting small amounts four times today.  Pt not able to keep any liquids or foods down.  Pt denies diarrhea, fever, or chills. Pt reports pain in right chest with "shortness of breath sometimes."

## 2014-11-23 ENCOUNTER — Ambulatory Visit: Payer: No Typology Code available for payment source | Admitting: Obstetrics & Gynecology

## 2014-12-21 ENCOUNTER — Ambulatory Visit (INDEPENDENT_AMBULATORY_CARE_PROVIDER_SITE_OTHER): Payer: 59 | Admitting: Obstetrics & Gynecology

## 2014-12-21 ENCOUNTER — Encounter: Payer: Self-pay | Admitting: Obstetrics & Gynecology

## 2014-12-21 VITALS — BP 101/63 | HR 72 | Temp 98.1°F | Wt 149.7 lb

## 2014-12-21 DIAGNOSIS — Z30430 Encounter for insertion of intrauterine contraceptive device: Secondary | ICD-10-CM

## 2014-12-21 DIAGNOSIS — Z3202 Encounter for pregnancy test, result negative: Secondary | ICD-10-CM

## 2014-12-21 DIAGNOSIS — Z124 Encounter for screening for malignant neoplasm of cervix: Secondary | ICD-10-CM

## 2014-12-21 MED ORDER — LEVONORGESTREL 20 MCG/24HR IU IUD
INTRAUTERINE_SYSTEM | Freq: Once | INTRAUTERINE | Status: AC
  Administered 2014-12-21: 1 via INTRAUTERINE

## 2014-12-21 NOTE — Progress Notes (Signed)
Patient ID: Wanda Norton, female   DOB: [birth year], 29 y.o.   MRN: [id number] GYNECOLOGY CLINIC PROCEDURE NOTE  Wanda Norton is a 29 y.o. G2P2002 here for Mirena IUD insertion. No GYN concerns.  Last pap smear was on 09/03/2009 and was normal.  IUD Insertion Procedure Note Patient identified, informed consent performed.  Discussed risks of irregular bleeding, cramping, infection, malpositioning or misplacement of the IUD outside the uterus which may require further procedures. Time out was performed.  Urine pregnancy test negative.  Speculum placed in the vagina.  Cervix visualized.  Cleaned with Betadine x 2.  Grasped anteriorly with a single tooth tenaculum.  Uterus sounded to 8 cm.  Mirena IUD placed per manufacturer's recommendations.  Strings trimmed to 3 cm. Tenaculum was removed, good hemostasis noted.  Patient tolerated procedure well.   Patient was given post-procedure instructions.  Patient was also asked to check IUD strings periodically and follow up in 4 weeks for IUD check. PAP obtained today

## 2014-12-21 NOTE — Patient Instructions (Signed)
Levonorgestrel intrauterine device (IUD) What is this medicine? LEVONORGESTREL IUD (LEE voe nor jes trel) is a contraceptive (birth control) device. The device is placed inside the uterus by a healthcare professional. It is used to prevent pregnancy and can also be used to treat heavy bleeding that occurs during your period. Depending on the device, it can be used for 3 to 5 years. This medicine may be used for other purposes; ask your health care provider or pharmacist if you have questions. COMMON BRAND NAME(S): LILETTA, Mirena, Skyla What should I tell my health care provider before I take this medicine? They need to know if you have any of these conditions: -abnormal Pap smear -cancer of the breast, uterus, or cervix -diabetes -endometritis -genital or pelvic infection now or in the past -have more than one sexual partner or your partner has more than one partner -heart disease -history of an ectopic or tubal pregnancy -immune system problems -IUD in place -liver disease or tumor -problems with blood clots or take blood-thinners -use intravenous drugs -uterus of unusual shape -vaginal bleeding that has not been explained -an unusual or allergic reaction to levonorgestrel, other hormones, silicone, or polyethylene, medicines, foods, dyes, or preservatives -pregnant or trying to get pregnant -breast-feeding How should I use this medicine? This device is placed inside the uterus by a health care professional. Talk to your pediatrician regarding the use of this medicine in children. Special care may be needed. Overdosage: If you think you have taken too much of this medicine contact a poison control center or emergency room at once. NOTE: This medicine is only for you. Do not share this medicine with others. What if I miss a dose? This does not apply. What may interact with this medicine? Do not take this medicine with any of the following  medications: -amprenavir -bosentan -fosamprenavir This medicine may also interact with the following medications: -aprepitant -barbiturate medicines for inducing sleep or treating seizures -bexarotene -griseofulvin -medicines to treat seizures like carbamazepine, ethotoin, felbamate, oxcarbazepine, phenytoin, topiramate -modafinil -pioglitazone -rifabutin -rifampin -rifapentine -some medicines to treat HIV infection like atazanavir, indinavir, lopinavir, nelfinavir, tipranavir, ritonavir -St. John's wort -warfarin This list may not describe all possible interactions. Give your health care provider a list of all the medicines, herbs, non-prescription drugs, or dietary supplements you use. Also tell them if you smoke, drink alcohol, or use illegal drugs. Some items may interact with your medicine. What should I watch for while using this medicine? Visit your doctor or health care professional for regular check ups. See your doctor if you or your partner has sexual contact with others, becomes HIV positive, or gets a sexual transmitted disease. This product does not protect you against HIV infection (AIDS) or other sexually transmitted diseases. You can check the placement of the IUD yourself by reaching up to the top of your vagina with clean fingers to feel the threads. Do not pull on the threads. It is a good habit to check placement after each menstrual period. Call your doctor right away if you feel more of the IUD than just the threads or if you cannot feel the threads at all. The IUD may come out by itself. You may become pregnant if the device comes out. If you notice that the IUD has come out use a backup birth control method like condoms and call your health care provider. Using tampons will not change the position of the IUD and are okay to use during your period. What side effects may   I notice from receiving this medicine? Side effects that you should report to your doctor or  health care professional as soon as possible: -allergic reactions like skin rash, itching or hives, swelling of the face, lips, or tongue -fever, flu-like symptoms -genital sores -high blood pressure -no menstrual period for 6 weeks during use -pain, swelling, warmth in the leg -pelvic pain or tenderness -severe or sudden headache -signs of pregnancy -stomach cramping -sudden shortness of breath -trouble with balance, talking, or walking -unusual vaginal bleeding, discharge -yellowing of the eyes or skin Side effects that usually do not require medical attention (report to your doctor or health care professional if they continue or are bothersome): -acne -breast pain -change in sex drive or performance -changes in weight -cramping, dizziness, or faintness while the device is being inserted -headache -irregular menstrual bleeding within first 3 to 6 months of use -nausea This list may not describe all possible side effects. Call your doctor for medical advice about side effects. You may report side effects to FDA at 1-800-FDA-1088. Where should I keep my medicine? This does not apply. NOTE: This sheet is a summary. It may not cover all possible information. If you have questions about this medicine, talk to your doctor, pharmacist, or health care provider.  2015, Elsevier/Gold Standard. (2011-08-28 13:54:04)  

## 2014-12-22 LAB — CYTOLOGY - PAP

## 2015-01-17 ENCOUNTER — Encounter: Payer: Self-pay | Admitting: Obstetrics & Gynecology

## 2015-01-17 ENCOUNTER — Ambulatory Visit (INDEPENDENT_AMBULATORY_CARE_PROVIDER_SITE_OTHER): Payer: 59 | Admitting: Obstetrics & Gynecology

## 2015-01-17 VITALS — BP 105/61 | HR 67 | Temp 98.6°F | Wt 149.2 lb

## 2015-01-17 DIAGNOSIS — A499 Bacterial infection, unspecified: Secondary | ICD-10-CM | POA: Diagnosis not present

## 2015-01-17 DIAGNOSIS — Z30431 Encounter for routine checking of intrauterine contraceptive device: Secondary | ICD-10-CM | POA: Diagnosis not present

## 2015-01-17 DIAGNOSIS — N760 Acute vaginitis: Secondary | ICD-10-CM

## 2015-01-17 DIAGNOSIS — B9689 Other specified bacterial agents as the cause of diseases classified elsewhere: Secondary | ICD-10-CM

## 2015-01-17 MED ORDER — METRONIDAZOLE 500 MG PO TABS: 500.0000 mg | ORAL_TABLET | Freq: Two times a day (BID) | ORAL | Status: AC

## 2015-01-17 NOTE — Progress Notes (Signed)
Patient ID: Wanda Norton, female   DOB: Jan 07, 1986, 29 y.o.   MRN: [id number] History:  29 y.o. G2P2002 here today for today for IUD string check; Mirena IUD was placed 12/21/2014. No complaints about the Mirena.  Pt does report foul smelling discharge of copious amounts- it began after the IUD placement.    The following portions of the patient's history were reviewed and updated as appropriate: allergies, current medications, past family history, past medical history, past social history, past surgical history and problem list.   Review of Systems:  Pertinent items are noted in HPI.  Objective:  Physical Exam Blood pressure 105/61, pulse 67, temperature 98.6 F (37 C), temperature source Oral, weight 149 lb 3.2 oz (67.677 kg), last menstrual period 01/03/2015. Gen: NAD Abd: Soft, nontender and nondistended Pelvic: Normal appearing external genitalia; normal appearing vaginal mucosa and cervix.  IUD strings visualized, about 3 cm in length outside cervix.   Assessment & Plan:  Normal IUD check. BV  Patient to keep IUD in place for five years; can come in for removal if she desires pregnancy within the next five years. Routine preventative health maintenance measures emphasized. Flagyl 500mg  bid x 7 days  Timara Loma L. Harraway-Smith, M.D., Evern CoreFACOG

## 2015-01-17 NOTE — Patient Instructions (Signed)
Bacterial Vaginosis Bacterial vaginosis is a vaginal infection that occurs when the normal balance of bacteria in the vagina is disrupted. It results from an overgrowth of certain bacteria. This is the most common vaginal infection in women of childbearing age. Treatment is important to prevent complications, especially in pregnant women, as it can cause a premature delivery. CAUSES  Bacterial vaginosis is caused by an increase in harmful bacteria that are normally present in smaller amounts in the vagina. Several different kinds of bacteria can cause bacterial vaginosis. However, the reason that the condition develops is not fully understood. RISK FACTORS Certain activities or behaviors can put you at an increased risk of developing bacterial vaginosis, including:  Having a new sex partner or multiple sex partners.  Douching.  Using an intrauterine device (IUD) for contraception. Women do not get bacterial vaginosis from toilet seats, bedding, swimming pools, or contact with objects around them. SIGNS AND SYMPTOMS  Some women with bacterial vaginosis have no signs or symptoms. Common symptoms include:  Grey vaginal discharge.  A fishlike odor with discharge, especially after sexual intercourse.  Itching or burning of the vagina and vulva.  Burning or pain with urination. DIAGNOSIS  Your health care provider will take a medical history and examine the vagina for signs of bacterial vaginosis. A sample of vaginal fluid may be taken. Your health care provider will look at this sample under a microscope to check for bacteria and abnormal cells. A vaginal pH test may also be done.  TREATMENT  Bacterial vaginosis may be treated with antibiotic medicines. These may be given in the form of a pill or a vaginal cream. A second round of antibiotics may be prescribed if the condition comes back after treatment.  HOME CARE INSTRUCTIONS   Only take over-the-counter or prescription medicines as  directed by your health care provider.  If antibiotic medicine was prescribed, take it as directed. Make sure you finish it even if you start to feel better.  Do not have sex until treatment is completed.  Tell all sexual partners that you have a vaginal infection. They should see their health care provider and be treated if they have problems, such as a mild rash or itching.  Practice safe sex by using condoms and only having one sex partner. SEEK MEDICAL CARE IF:   Your symptoms are not improving after 3 days of treatment.  You have increased discharge or pain.  You have a fever. MAKE SURE YOU:   Understand these instructions.  Will watch your condition.  Will get help right away if you are not doing well or get worse. FOR MORE INFORMATION  Centers for Disease Control and Prevention, Division of STD Prevention: www.cdc.gov/std American Sexual Health Association (ASHA): www.ashastd.org  Document Released: 07/28/2005 Document Revised: 05/18/2013 Document Reviewed: 03/09/2013 ExitCare Patient Information 2015 [employer]. This information is not intended to replace advice given to you by your health care provider. Make sure you discuss any questions you have with your health care provider.  

## 2015-04-30 ENCOUNTER — Inpatient Hospital Stay (HOSPITAL_COMMUNITY)
Admission: AD | Admit: 2015-04-30 | Discharge: 2015-04-30 | Disposition: A | Discharge status: Home / Self Care | Payer: 59 | Source: Ambulatory Visit | Attending: Family Medicine | Admitting: Family Medicine

## 2015-04-30 ENCOUNTER — Encounter (HOSPITAL_COMMUNITY): Payer: Self-pay | Admitting: *Deleted

## 2015-04-30 DIAGNOSIS — N72 Inflammatory disease of cervix uteri: Secondary | ICD-10-CM | POA: Diagnosis not present

## 2015-04-30 DIAGNOSIS — R109 Unspecified abdominal pain: Secondary | ICD-10-CM | POA: Insufficient documentation

## 2015-04-30 LAB — URINE MICROSCOPIC-ADD ON

## 2015-04-30 LAB — URINALYSIS, ROUTINE W REFLEX MICROSCOPIC
Bilirubin Urine: NEGATIVE
Glucose, UA: NEGATIVE mg/dL
Ketones, ur: NEGATIVE mg/dL
NITRITE: NEGATIVE
PH: 5.5 (ref 5.0–8.0)
Protein, ur: NEGATIVE mg/dL
SPECIFIC GRAVITY, URINE: 1.025 (ref 1.005–1.030)
UROBILINOGEN UA: 0.2 mg/dL (ref 0.0–1.0)

## 2015-04-30 LAB — WET PREP, GENITAL
CLUE CELLS WET PREP: NONE SEEN
TRICH WET PREP: NONE SEEN
YEAST WET PREP: NONE SEEN

## 2015-04-30 LAB — POCT PREGNANCY, URINE: PREG TEST UR: NEGATIVE

## 2015-04-30 MED ORDER — AZITHROMYCIN 250 MG PO TABS
1000.0000 mg | ORAL_TABLET | Freq: Once | ORAL | Status: AC
  Administered 2015-04-30: 1000 mg via ORAL
  Filled 2015-04-30: qty 4

## 2015-04-30 NOTE — Discharge Instructions (Signed)
Cervicitis (Cervicitis) Es el dolor e hinchazn (inflamacin) del cuello uterino. El cuello del tero se encuentra en la parte inferior del tero. Se abre hacia la vagina. CAUSAS   Enfermedades de transmisin sexual (ETS).   Reacciones alrgicas.   Medicamentos o dispositivos anticonceptivos que se Research officer, trade union.   Traumatismo en el cuello del tero.   Infecciones bacterianas.  FACTORES DE RIESGO Usted tendr mayor riesgo de sufrir esta enfermedad si:  Tiene relaciones sexuales sin proteccin.  Ha tenido relaciones sexuales con varias parejas.  Comienza a Management consultant a una edad temprana.  Tiene una historia de ETS. SNTOMAS  Puede ser que no haya sntomas. Si hay sntomas, pueden ser:   Secrecin vaginal de color gris, blanco, amarillo o que tiene mal olor.   Picazn o dolor en la zona externa de la vagina.   Relaciones sexuales dolorosas.   Dolor en la zona baja del abdomen o la espalda, especialmente durante las [employer].   Ganas de orinar con frecuencia.   Sangrado vaginal anormal entre perodos, despus de las relaciones sexuales o despus de la menopausia.   Sensacin de presin o pesadez en la pelvis.  DIAGNSTICO  El diagnstico se realiza mediante un examen plvico. Otras pruebas son:   Examen de las secreciones en el microscopio (preparado fresco).   Papanicolau  TRATAMIENTO  El tratamiento depender de la causa de la cervicitis. Si la causa es una enfermedad de transmisin sexual, usted y su pareja necesitarn Pensions consultant. Le prescribirn antibiticos.  INSTRUCCIONES PARA EL CUIDADO EN EL HOGAR   No tenga relaciones sexuales hasta que el mdico la autorice.   No tenga relaciones sexuales hasta que su compaero haya sido tratado si la causa de la cervicitis es una enfermedad de transmisin sexual.   Tome los antibiticos como se le indic. Tmelos todos, aunque se sienta mejor.  SOLICITE  ATENCIN MDICA SI:  Los sntomas vuelven a Research officer, trade union.   Tiene fiebre.  ASEGRESE DE QUE:   Comprende estas instrucciones.  Controlar su afeccin.  Recibir ayuda de inmediato si no mejora o si empeora. Document Released: 07/28/2005 Document Revised: 03/30/2013 [facility] Patient Information 2015 [place], [place]. This information is not intended to replace advice given to you by your health care provider. Make sure you discuss any questions you have with your health care provider.

## 2015-04-30 NOTE — MAU Provider Note (Signed)
  History   G2P2 married hispanic female in with c/o low abd pain and cramping for 1 week. Denies discharge.  CSN: [id number]  Arrival date and time: 04/30/15 1240   None     Chief Complaint  Patient presents with  . Abdominal Pain  . Back Pain  . Dizziness   HPI  OB History    Gravida Para Term Preterm AB TAB SAB Ectopic Multiple Living   History reviewed. No pertinent past medical history.  Past Surgical History  Procedure Laterality Date  . No past surgeries      No family history on file.  Social History  Substance Use Topics  . Smoking status: Never Smoker   . Smokeless tobacco: Never Used  . Alcohol Use: No    Allergies: No Known Allergies  No prescriptions prior to admission    Review of Systems  Constitutional: Negative.   HENT: Negative.   Eyes: Negative.   Respiratory: Negative.   Cardiovascular: Negative.   Gastrointestinal: Positive for abdominal pain.  Genitourinary: Negative.   Musculoskeletal: Positive for back pain.  Skin: Negative.   Neurological: Negative.   Endo/Heme/Allergies: Negative.   Psychiatric/Behavioral: Negative.    Physical Exam   Blood pressure 111/64, pulse 90, temperature 99.7 F (37.6 C), temperature source Oral, resp. rate 18, height 5' 2.5" (1.588 m), weight 154 lb 12.8 oz (70.217 kg), last menstrual period 03/16/2015.  Physical Exam  Constitutional: She is oriented to person, place, and time. She appears well-developed and well-nourished.  HENT:  Head: Normocephalic.  Neck: Normal range of motion.  Cardiovascular: Normal rate, regular rhythm, normal heart sounds and intact distal pulses.   Respiratory: Effort normal and breath sounds normal.  GI: Soft. Bowel sounds are normal.  Genitourinary: Vagina normal.  Uterus non tender but pt has cervical motion tenderness. IUD string in place  Musculoskeletal: Normal range of motion.  Neurological: She is alert and oriented to person, place, and  time. She has normal reflexes.  Skin: Skin is warm and dry.  Psychiatric: She has a normal mood and affect. Her behavior is normal. Judgment and thought content normal.    MAU Course  Procedures  MDM cervicitis  Assessment and Plan  Sterile spec exam done, cultures obtained, wet prep obtained. Bimanual exam show pos CMT, uterus non tender. Will give Azithromycin 1 gm po.  LAWSON, MARIE DARLENE 04/30/2015, 1:21 PM

## 2015-04-30 NOTE — MAU Note (Signed)
Pain in lower abd off and on for past wk, really bad last night was in tears.  Has been dizzy today. Pain in back started today.

## 2015-05-01 LAB — GC/CHLAMYDIA PROBE AMP (~~LOC~~) NOT AT ARMC
CHLAMYDIA, DNA PROBE: NEGATIVE
NEISSERIA GONORRHEA: NEGATIVE

## 2015-05-16 ENCOUNTER — Ambulatory Visit: Payer: 59 | Admitting: Obstetrics and Gynecology

## 2015-09-29 ENCOUNTER — Emergency Department (HOSPITAL_COMMUNITY): Admission: EM | Admit: 2015-09-29 | Discharge: 2015-09-29 | Discharge status: Absent without leave | Payer: Self-pay

## 2015-09-29 ENCOUNTER — Encounter (HOSPITAL_COMMUNITY): Payer: Self-pay

## 2015-09-29 ENCOUNTER — Inpatient Hospital Stay (HOSPITAL_COMMUNITY)
Admission: AD | Admit: 2015-09-29 | Discharge: 2015-09-29 | Disposition: A | Discharge status: Home / Self Care | Payer: Medicaid Other | Source: Ambulatory Visit | Attending: Obstetrics and Gynecology | Admitting: Obstetrics and Gynecology

## 2015-09-29 ENCOUNTER — Inpatient Hospital Stay (HOSPITAL_COMMUNITY): Payer: Medicaid Other

## 2015-09-29 DIAGNOSIS — O210 Mild hyperemesis gravidarum: Secondary | ICD-10-CM

## 2015-09-29 DIAGNOSIS — Z3A01 Less than 8 weeks gestation of pregnancy: Secondary | ICD-10-CM | POA: Insufficient documentation

## 2015-09-29 DIAGNOSIS — R110 Nausea: Secondary | ICD-10-CM | POA: Insufficient documentation

## 2015-09-29 DIAGNOSIS — Z0489 Encounter for examination and observation for other specified reasons: Secondary | ICD-10-CM

## 2015-09-29 DIAGNOSIS — O219 Vomiting of pregnancy, unspecified: Secondary | ICD-10-CM | POA: Diagnosis not present

## 2015-09-29 DIAGNOSIS — O26891 Other specified pregnancy related conditions, first trimester: Secondary | ICD-10-CM | POA: Insufficient documentation

## 2015-09-29 LAB — CBC
HEMATOCRIT: 34.4 % — AB (ref 36.0–46.0)
HEMOGLOBIN: 11.6 g/dL — AB (ref 12.0–15.0)
MCH: 29.1 pg (ref 26.0–34.0)
MCHC: 33.7 g/dL (ref 30.0–36.0)
MCV: 86.2 fL (ref 78.0–100.0)
Platelets: 215 10*3/uL (ref 150–400)
RBC: 3.99 MIL/uL (ref 3.87–5.11)
RDW: 13.8 % (ref 11.5–15.5)
WBC: 4.4 10*3/uL (ref 4.0–10.5)

## 2015-09-29 LAB — URINALYSIS, ROUTINE W REFLEX MICROSCOPIC
BILIRUBIN URINE: NEGATIVE
Glucose, UA: NEGATIVE mg/dL
Hgb urine dipstick: NEGATIVE
Ketones, ur: NEGATIVE mg/dL
NITRITE: NEGATIVE
PROTEIN: NEGATIVE mg/dL
SPECIFIC GRAVITY, URINE: 1.015 (ref 1.005–1.030)
pH: 7 (ref 5.0–8.0)

## 2015-09-29 LAB — URINE MICROSCOPIC-ADD ON
BACTERIA UA: NONE SEEN
RBC / HPF: NONE SEEN RBC/hpf (ref 0–5)

## 2015-09-29 LAB — HCG, QUANTITATIVE, PREGNANCY: hCG, Beta Chain, Quant, S: 35364 m[IU]/mL — ABNORMAL HIGH (ref <5)

## 2015-09-29 LAB — POCT PREGNANCY, URINE: PREG TEST UR: POSITIVE — AB

## 2015-09-29 MED ORDER — PROMETHAZINE HCL 25 MG PO TABS: 25.0000 mg | ORAL_TABLET | Freq: Four times a day (QID) | ORAL | Status: DC | PRN

## 2015-09-29 NOTE — MAU Note (Addendum)
Patient has Mirena for 10 months, took a home test was positive, no vaginal bleeding, having nausea. LMP 08/21/15

## 2015-09-29 NOTE — Discharge Instructions (Signed)
Make an appointment to begin prenatal care Return if you have severe abdominal pain or vaginal bleeding

## 2015-09-29 NOTE — MAU Note (Signed)
Pt has a mirena in place; she has had the mirena for 10 months but she is also [redacted]w[redacted]d pregnant; c/o a lot of N&V;

## 2015-09-29 NOTE — MAU Provider Note (Signed)
History     CSN: [id number]  Arrival date and time: 09/29/15 4098   First Provider Initiated Contact with Patient 09/29/15 1018      No chief complaint on file.  HPI Wanda Norton 30 y.o. [redacted]w[redacted]d  Comes to MAU as she has been having nausea and sesitivities to odors like when she is pregnant.  Did a pregnancy test at home and it was positive.  She has a IUD and called and was told she needed to be seen to take out the IUD.  Came today to have the IUD removed.  OB History    Gravida Para Term Preterm AB TAB SAB Ectopic Multiple Living   Past Medical History  Diagnosis Date  . Medical history non-contributory     Past Surgical History  Procedure Laterality Date  . No past surgeries      Family History  Problem Relation Age of Onset  . Alcohol abuse Neg Hx   . Arthritis Neg Hx   . Asthma Neg Hx   . Birth defects Neg Hx   . Cancer Neg Hx   . COPD Neg Hx   . Depression Neg Hx   . Diabetes Neg Hx   . Drug abuse Neg Hx   . Early death Neg Hx   . Hearing loss Neg Hx   . Heart disease Neg Hx   . Hyperlipidemia Neg Hx   . Hypertension Neg Hx   . Kidney disease Neg Hx   . Learning disabilities Neg Hx   . Mental illness Neg Hx   . Mental retardation Neg Hx   . Miscarriages / Stillbirths Neg Hx   . Stroke Neg Hx   . Vision loss Neg Hx   . Varicose Veins Neg Hx     Social History  Substance Use Topics  . Smoking status: Never Smoker   . Smokeless tobacco: Never Used  . Alcohol Use: No    Allergies: No Known Allergies  Prescriptions prior to admission  Medication Sig Dispense Refill Last Dose  . Ibuprofen (ADVIL) 200 MG CAPS Take 2 capsules by mouth daily.   prn    Review of Systems  Constitutional: Negative for fever.  Gastrointestinal: Positive for nausea and vomiting. Negative for abdominal pain.  Genitourinary:       No vaginal discharge. No vaginal bleeding. No dysuria.   Physical Exam   Blood pressure 114/70, pulse 80, temperature  97.8 F (36.6 C), temperature source Oral, resp. rate 16, height  (1.626 m), weight 156 lb 12.8 oz (71.124 kg), last menstrual period 08/21/2015.  Physical Exam  Nursing note and vitals reviewed. Constitutional: She is oriented to person, place, and time. She appears well-developed and well-nourished.  HENT:  Head: Normocephalic.  Eyes: EOM are normal.  Neck: Neck supple.  GI: Soft. There is no tenderness.  Genitourinary:  Speculum exam: Vagina - Small amount of creamy discharge, no odor Cervix - No contact bleeding, no IUD strings seen even after exploration of internal cervical os with small swab Chaperone present for exam.  Musculoskeletal: Normal range of motion.  Neurological: She is alert and oriented to person, place, and time.  Skin: Skin is warm and dry.  Psychiatric: She has a normal mood and affect.    MAU Course  Procedures  Results for orders placed or performed during the hospital encounter of 09/29/15 (from the past 24 hour(s))  Urinalysis, Routine  w reflex microscopic (not at [facility])     Status: Abnormal   Collection Time: 09/29/15  9:50 AM  Result Value Ref Range   Color, Urine YELLOW YELLOW   APPearance CLEAR CLEAR   Specific Gravity, Urine 1.015 1.005 - 1.030   pH 7.0 5.0 - 8.0   Glucose, UA NEGATIVE NEGATIVE mg/dL   Hgb urine dipstick NEGATIVE NEGATIVE   Bilirubin Urine NEGATIVE NEGATIVE   Ketones, ur NEGATIVE NEGATIVE mg/dL   Protein, ur NEGATIVE NEGATIVE mg/dL   Nitrite NEGATIVE NEGATIVE   Leukocytes, UA SMALL (A) NEGATIVE  Urine microscopic-add on     Status: Abnormal   Collection Time: 09/29/15  9:50 AM  Result Value Ref Range   Squamous Epithelial / LPF 6-30 (A) NONE SEEN   WBC, UA 0-5 0 - 5 WBC/hpf   RBC / HPF NONE SEEN 0 - 5 RBC/hpf   Bacteria, UA NONE SEEN NONE SEEN   Urine-Other MUCOUS PRESENT   Pregnancy, urine POC     Status: Abnormal   Collection Time: 09/29/15  9:52 AM  Result Value Ref Range   Preg Test, Ur POSITIVE (A) NEGATIVE   CBC     Status: Abnormal   Collection Time: 09/29/15 10:34 AM  Result Value Ref Range   WBC 4.4 4.0 - 10.5 K/uL   RBC 3.99 3.87 - 5.11 MIL/uL   Hemoglobin 11.6 (L) 12.0 - 15.0 g/dL   HCT 16.1 (L) 09.6 - 04.5 %   MCV 86.2 78.0 - 100.0 fL   MCH 29.1 26.0 - 34.0 pg   MCHC 33.7 30.0 - 36.0 g/dL   RDW 40.9 81.1 - 91.4 %   Platelets 215 150 - 400 K/uL  hCG, quantitative, pregnancy     Status: Abnormal   Collection Time: 09/29/15 10:34 AM  Result Value Ref Range   hCG, Beta Chain, Quant, S 35364 (H) <5 mIU/mL    CLINICAL DATA: Positive urine pregnancy test. Patient with a Mirena IUD. Beta HCG level, 35,364. Patient 5 weeks and 4 days pregnant based on the last menstrual period.  EXAM: OBSTETRIC <14 WK [place] AND TRANSVAGINAL OB US  TECHNIQUE: Both transabdominal and transvaginal ultrasound examinations were performed for complete evaluation of the gestation as well as the maternal uterus, adnexal regions, and pelvic cul-de-sac. Transvaginal technique was performed to assess early pregnancy.  COMPARISON: None.  FINDINGS: Intrauterine gestational sac: Visualized/normal in shape.  Yolk sac: Yes  Embryo: No  MSD: 15.2 mm 6 w 2 d  Subchorionic hemorrhage: Questionable small subchronic hemorrhage along the left aspect of the uterus measuring 16 x 3 x 9 mm.  Maternal uterus/adnexae: No uterine masses. Cervix is closed. Normal ovaries. No adnexal masses or free fluid.  No IUD visualized.  IMPRESSION: 1. Gestational sac within the uterus with a yolk sac. Probable early intrauterine gestational sac, but fetal pole, or cardiac activity yet visualized. Recommend follow-up quantitative B-HCG levels and follow-up US in 14 days to confirm and assess viability. This recommendation follows SRU consensus guidelines: Diagnostic Criteria for Nonviable Pregnancy Early in the First Trimester. Malva Limes Med 2013; 782:9562-13. 2. Possible small subchronic hemorrhage. 3.  No other evidence of a pregnancy complication. 4. Normal ovaries and adnexa. 5. No IUD visualized.   MDM Will get ultrasound to see if IUD is in place. Preliminary ultrasound results shown no IUD in the uterus.  Client instructed to call and make an appointment to begin prenatal care.  She has had vomiting in this pregnancy and with previous  pregnancies has lots of vomiting for 4 months. Requests medication not to vomit.  Discussed at length with client.    Assessment and Plan  Morning sickness No IUD in the uterus  Plan Begin prenatal care Will eprescribe Phenergan for client.  Instructed she can take 1/2 tablet for mild symptoms.  Sherrice Creekmore 09/29/2015, 10:18 AM

## 2015-11-06 ENCOUNTER — Inpatient Hospital Stay (HOSPITAL_COMMUNITY)
Admission: AD | Admit: 2015-11-06 | Discharge: 2015-11-06 | Disposition: A | Discharge status: Home / Self Care | Payer: Medicaid Other | Source: Ambulatory Visit | Attending: Family Medicine | Admitting: Family Medicine

## 2015-11-06 ENCOUNTER — Inpatient Hospital Stay (HOSPITAL_COMMUNITY): Payer: Medicaid Other

## 2015-11-06 ENCOUNTER — Encounter (HOSPITAL_COMMUNITY): Payer: Self-pay | Admitting: *Deleted

## 2015-11-06 DIAGNOSIS — O034 Incomplete spontaneous abortion without complication: Secondary | ICD-10-CM

## 2015-11-06 DIAGNOSIS — N939 Abnormal uterine and vaginal bleeding, unspecified: Secondary | ICD-10-CM | POA: Diagnosis present

## 2015-11-06 DIAGNOSIS — Z3A11 11 weeks gestation of pregnancy: Secondary | ICD-10-CM | POA: Insufficient documentation

## 2015-11-06 DIAGNOSIS — O021 Missed abortion: Secondary | ICD-10-CM | POA: Insufficient documentation

## 2015-11-06 HISTORY — DX: Headache, unspecified: R51.9

## 2015-11-06 HISTORY — DX: Headache: R51

## 2015-11-06 LAB — URINALYSIS, ROUTINE W REFLEX MICROSCOPIC
BILIRUBIN URINE: NEGATIVE
Glucose, UA: NEGATIVE mg/dL
KETONES UR: NEGATIVE mg/dL
LEUKOCYTES UA: NEGATIVE
NITRITE: NEGATIVE
PROTEIN: NEGATIVE mg/dL
Specific Gravity, Urine: 1.01 (ref 1.005–1.030)
pH: 7.5 (ref 5.0–8.0)

## 2015-11-06 LAB — CBC
HCT: 33.7 % — ABNORMAL LOW (ref 36.0–46.0)
HEMOGLOBIN: 11.3 g/dL — AB (ref 12.0–15.0)
MCH: 29.4 pg (ref 26.0–34.0)
MCHC: 33.5 g/dL (ref 30.0–36.0)
MCV: 87.8 fL (ref 78.0–100.0)
PLATELETS: 181 10*3/uL (ref 150–400)
RBC: 3.84 MIL/uL — AB (ref 3.87–5.11)
RDW: 13.9 % (ref 11.5–15.5)
WBC: 4.9 10*3/uL (ref 4.0–10.5)

## 2015-11-06 LAB — URINE MICROSCOPIC-ADD ON: WBC, UA: NONE SEEN WBC/hpf (ref 0–5)

## 2015-11-06 LAB — WET PREP, GENITAL
SPERM: NONE SEEN
TRICH WET PREP: NONE SEEN
YEAST WET PREP: NONE SEEN

## 2015-11-06 MED ORDER — OXYCODONE-ACETAMINOPHEN 5-325 MG PO TABS
1.0000 | ORAL_TABLET | Freq: Four times a day (QID) | ORAL | Status: DC | PRN
Start: 2015-11-06 — End: 2016-06-11

## 2015-11-06 MED ORDER — MISOPROSTOL 200 MCG PO TABS: ORAL_TABLET | ORAL | Status: DC

## 2015-11-06 MED ORDER — PROMETHAZINE HCL 25 MG PO TABS: 25.0000 mg | ORAL_TABLET | Freq: Four times a day (QID) | ORAL | Status: DC | PRN

## 2015-11-06 NOTE — MAU Note (Signed)
Noted red spotting this morning.  Denies pain or recent intercourse-

## 2015-11-06 NOTE — MAU Provider Note (Signed)
Chief Complaint: Vaginal Bleeding   First Provider Initiated Contact with Patient 11/06/15 1020        SUBJECTIVE Wanda Norton is a 30 y.o. G3P2002 at [redacted]w[redacted]d by LMP who presents to maternity admissions reporting spotting since this morning. Denies cramping. She denies vaginal itching/burning, urinary symptoms, h/a, dizziness, n/v, or fever/chills.   Was seen in February for nausea and US showed IUGS with yolk sac but no fetal pole.  Vaginal Bleeding The patient's primary symptoms include vaginal bleeding. The patient's pertinent negatives include no genital rash, pelvic pain or vaginal discharge. This is a new problem. The current episode started today. The problem has been unchanged. The patient is experiencing no pain. She is pregnant. Pertinent negatives include no abdominal pain, back pain, chills, constipation, diarrhea, dysuria, fever, frequency, headaches, nausea or vomiting. The vaginal discharge was bloody. The vaginal bleeding is lighter than menses. She has not been passing clots. She has not been passing tissue. Nothing aggravates the symptoms. She has tried nothing for the symptoms. She uses nothing for contraception.     Past Medical History  Diagnosis Date  . Medical history non-contributory   . Headache    Past Surgical History  Procedure Laterality Date  . No past surgeries     Social History   Social History  . Marital Status: Married    Spouse Name: N/A  . Number of Children: N/A  . Years of Education: N/A   Occupational History  . Not on file.   Social History Main Topics  . Smoking status: Never Smoker   . Smokeless tobacco: Never Used  . Alcohol Use: No  . Drug Use: No  . Sexual Activity: Yes    Birth Control/ Protection: None, IUD     Comment: Mirena x61months   Other Topics Concern  . Not on file   Social History Narrative   No current facility-administered medications on file prior to encounter.   Current Outpatient Prescriptions on File Prior  to Encounter  Medication Sig Dispense Refill  . promethazine (PHENERGAN) 25 MG tablet Take 1 tablet (25 mg total) by mouth every 6 (six) hours as needed for nausea or vomiting. 30 tablet 0   No Known Allergies  I have reviewed patient's Past Medical Hx, Surgical Hx, Family Hx, Social Hx, medications and allergies.   ROS:  Review of Systems  Constitutional: Negative for fever and chills.  Respiratory: Negative for shortness of breath.   Gastrointestinal: Negative for nausea, vomiting, abdominal pain, diarrhea and constipation.  Genitourinary: Positive for vaginal bleeding. Negative for dysuria, frequency, vaginal discharge and pelvic pain.  Musculoskeletal: Negative for back pain.  Neurological: Negative for headaches.   Other systems negative  Physical Exam  Patient Vitals for the past 24 hrs:  BP Temp Temp src Pulse Resp Height Weight  11/06/15 1003 104/61 mmHg 98.6 F (37 C) Oral 69 16 - -  11/06/15 0942 - - - - -  (1.626 m) 158 lb 3.2 oz (71.759 kg)    Physical Exam  Constitutional: Well-developed, well-nourished female in no acute distress.  Cardiovascular: normal rate Respiratory: normal effort GI: Abd soft, non-tender. Pos BS x 4 MS: Extremities nontender, no edema, normal ROM Neurologic: Alert and oriented x 4.  GU: Neg CVAT.  PELVIC EXAM: Cervix pink, visually closed, without lesion, small amount of mucous/bloody discharge at cervical os, vaginal walls and external genitalia normal Bimanual exam: Cervix 0/long/high, firm, anterior, neg CMT, uterus nontender, uterus difficult to assess due to retroversion, ?8  wk size, adnexa without tenderness, enlargement, or mass  FHT not audible by doppler  LAB RESULTS    Results for orders placed or performed during the hospital encounter of 11/06/15 (from the past 24 hour(s))  Urinalysis, Routine w reflex microscopic ([facility])     Status: Abnormal   Collection Time: 11/06/15  9:40 AM  Result Value Ref Range   Color,  Urine YELLOW YELLOW   APPearance CLEAR CLEAR   Specific Gravity, Urine 1.010 1.005 - 1.030   pH 7.5 5.0 - 8.0   Glucose, UA NEGATIVE NEGATIVE mg/dL   Hgb urine dipstick LARGE (A) NEGATIVE   Bilirubin Urine NEGATIVE NEGATIVE   Ketones, ur NEGATIVE NEGATIVE mg/dL   Protein, ur NEGATIVE NEGATIVE mg/dL   Nitrite NEGATIVE NEGATIVE   Leukocytes, UA NEGATIVE NEGATIVE  Urine microscopic-add on     Status: Abnormal   Collection Time: 11/06/15  9:40 AM  Result Value Ref Range   Squamous Epithelial / LPF 0-5 (A) NONE SEEN   WBC, UA NONE SEEN 0 - 5 WBC/hpf   RBC / HPF 0-5 0 - 5 RBC/hpf   Bacteria, UA RARE (A) NONE SEEN   Urine-Other MUCOUS PRESENT   CBC     Status: Abnormal   Collection Time: 11/06/15 10:30 AM  Result Value Ref Range   WBC 4.9 4.0 - 10.5 K/uL   RBC 3.84 (L) 3.87 - 5.11 MIL/uL   Hemoglobin 11.3 (L) 12.0 - 15.0 g/dL   HCT 16.133.7 (L) 09.636.0 - 04.546.0 %   MCV 87.8 78.0 - 100.0 fL   MCH 29.4 26.0 - 34.0 pg   MCHC 33.5 30.0 - 36.0 g/dL   RDW 40.913.9 81.111.5 - 91.415.5 %   Platelets 181 150 - 400 K/uL  Wet prep, genital     Status: Abnormal   Collection Time: 11/06/15 11:06 AM  Result Value Ref Range   Yeast Wet Prep HPF POC NONE SEEN NONE SEEN   Trich, Wet Prep NONE SEEN NONE SEEN   Clue Cells Wet Prep HPF POC PRESENT (A) NONE SEEN   WBC, Wet Prep HPF POC MODERATE (A) NONE SEEN   Sperm NONE SEEN      IMAGING [place] Ob Transvaginal  11/06/2015  ADDENDUM REPORT: 11/06/2015 12:04 ADDENDUM: Patient has a prior ultrasound dated 09/29/2015. By current ultrasound dates, it was unclear that this reflects the same pregnancy, and therefore comparison to that ultrasound was not provided. If this in fact reflects the same a gestational sac that was noted on the prior, than this suggests inappropriate growth of the gestational sac, and an abnormal crown-rump. In that clinical setting, these findings would be compatible with failed pregnancy. However, if this reflects a new pregnancy from 09/29/2015, the  original interpretation is still valid. Correlate with clinical history and serial beta HCG (if available) to determine whether this is likely to be the same gestational sac that was visualized on 09/29/2015. Electronically Signed   By: Charline BillsSriyesh  Krishnan M.D.   On: 11/06/2015 12:04  11/06/2015  CLINICAL DATA:  Pregnant, vaginal bleeding EXAM: TRANSVAGINAL OB ULTRASOUND TECHNIQUE: Transvaginal ultrasound was performed for complete evaluation of the gestation as well as the maternal uterus, adnexal regions, and pelvic cul-de-sac. COMPARISON:  None. FINDINGS: Intrauterine gestational sac: Visualized/normal in shape. Yolk sac:  Present Embryo:  Suspected Cardiac Activity: Not visualized MSD: 19.5  mm   6 w   6  d CRL:   4.8  mm   6 w 1 d  [place] EDC: 06/30/2016 Subchorionic hemorrhage:  Large subchronic hemorrhage. Maternal uterus/adnexae: Bilateral ovaries are within normal limits. No free fluid IMPRESSION: Single intrauterine gestation with yolk sac and a suspected small fetal pole, measuring 6 weeks 1 day by crown-rump length. No definite cardiac activity is visualized, although this may be due to small size/poor visualization. Serial beta HCG is suggested. Follow-up pelvic ultrasound is suggested in 10-14 days to confirm viability. Electronically Signed: By: Charline Bills M.D. On: 11/06/2015 11:04    MAU Management/MDM: Ordered CBC, HIV, RPR   Cultures and wet prep done.   Dr Shawnie Pons consulted.  Previous US reviewed and compared to today's Ultrasound.  Last [place] was done on 09/29/15 and showed a 6 week sac with yolk sac.  Today there has been no interval growth of sac or fetus in the past 5 weeks. There is also a new large subchorionic hemorrhage.  I think this indicates an inevitable abortion. Reviewed amended [place] report. I feel it is very unlikely that a new fetal pole would appear in 5 weeks' time.  Reviewed with Dr Shawnie Pons who agrees   Discussed with patient who is visibly upset.  She is  calling a family member to come be with her. Discussed options of Expectant management, cytotec, or D&C.  She is too upset to decide right now. Will re-discuss when her family gets here.  Pt stable at time of discharge.  ASSESSMENT Pregnancy at [redacted]w[redacted]d by early US/LMP Incomplete/missed abortion  PLAN Discussed options with patient and her family They opt to proceed with Cytotec at home Rx given for Cytotec for vaginal use Rx for Percocet for pain Rx for Phenergan for nausea  Early Intrauterine Pregnancy Failure Protocol  X Documented intrauterine pregnancy failure less than or equal to [redacted] weeks gestation  X No serious current illness  X Baseline Hgb greater than or equal to 10g/dl  X Patient has easily accessible transportation to the hospital  X Clear preference  X Practitioner/physician deems patient reliable  X Counseling by practitioner or physician  X Patient education by RN  X Consent form signed  Rho-Gam given by RN if indicated  _x_ Cytotec 800 mcg Intravaginally by patient at home   X Ibuprofen 600 mg 1 tablet by mouth every 6 hours as needed #30 - prescribed  X Percocet 5-325 by mouth every 4 to 6 hours as needed - prescribed  _x_ Phenergan 25 mg by mouth every 4 hours as needed for nausea - prescribed    Discharge home Message sent for followup in clinic 2-4 weeks Bleeding precautions    Medication List    ASK your doctor about these medications        promethazine 25 MG tablet  Commonly known as:  PHENERGAN  Take 1 tablet (25 mg total) by mouth every 6 (six) hours as needed for nausea or vomiting.         Encouraged to return here or to other Urgent Care/ED if she develops worsening of symptoms, increase in pain, fever, or other concerning symptoms.    Wynelle Bourgeois CNM, MSN Certified Nurse-Midwife 11/06/2015  10:44 AM

## 2015-11-06 NOTE — Discharge Instructions (Signed)
Incomplete Miscarriage A miscarriage is the sudden loss of an unborn baby (fetus) before the 20th week of pregnancy. In an incomplete miscarriage, parts of the fetus or placenta (afterbirth) remain in the body.  Having a miscarriage can be an emotional experience. Talk with your health care provider about any questions you may have about miscarrying, the grieving process, and your future pregnancy plans. CAUSES   Problems with the fetal chromosomes that make it impossible for the baby to develop normally. Problems with the baby's genes or chromosomes are most often the result of errors that occur by chance as the embryo divides and grows. The problems are not inherited from the parents.  Infection of the cervix or uterus.  Hormone problems.  Problems with the cervix, such as having an incompetent cervix. This is when the tissue in the cervix is not strong enough to hold the pregnancy.  Problems with the uterus, such as an abnormally shaped uterus, uterine fibroids, or congenital abnormalities.  Certain medical conditions.  Smoking, drinking alcohol, or taking illegal drugs.  Trauma. SYMPTOMS   Vaginal bleeding or spotting, with or without cramps or pain.  Pain or cramping in the abdomen or lower back.  Passing fluid, tissue, or blood clots from the vagina. DIAGNOSIS  Your health care provider will perform a physical exam. You may also have an ultrasound to confirm the miscarriage. Blood or urine tests may also be ordered. TREATMENT   Usually, a dilation and curettage (D&C) procedure is performed. During a D&C procedure, the cervix is widened (dilated) and any remaining fetal or placental tissue is gently removed from the uterus.  Antibiotic medicines are prescribed if there is an infection. Other medicines may be given to reduce the size of the uterus (contract) if there is a lot of bleeding.  If you have Rh negative blood and your baby was Rh positive, you will need a Rho (D)  immune globulin shot. This shot will protect any future baby from having Rh blood problems in future pregnancies.  You may be confined to bed rest. This means you should stay in bed and only get up to use the bathroom. HOME CARE INSTRUCTIONS   Rest as directed by your health care provider.  Restrict activity as directed by your health care provider. You may be allowed to continue light activity if curettage was not done but you require further treatment.  Keep track of the number of pads you use each day. Keep track of how soaked (saturated) they are. Record this information.  Do not  use tampons.  Do not douche or have sexual intercourse until approved by your health care provider.  Keep all follow-up appointments for reevaluation and continuing management.  Only take over-the-counter or prescription medicines for pain, fever, or discomfort as directed by your health care provider.  Take antibiotic medicine as directed by your health care provider. Make sure you finish it even if you start to feel better. SEEK IMMEDIATE MEDICAL CARE IF:   You experience severe cramps in your stomach, back, or abdomen.  You have an unexplained temperature (make sure to record these temperatures).  You pass large clots or tissue (save these for your health care provider to inspect).  Your bleeding increases.  You become light-headed, weak, or have fainting episodes. MAKE SURE YOU:   Understand these instructions.  Will watch your condition.  Will get help right away if you are not doing well or get worse.   This information is not intended to   replace advice given to you by your health care provider. Make sure you discuss any questions you have with your health care provider.   Document Released: 07/28/2005 Document Revised: 08/18/2014 Document Reviewed: 02/24/2013 Elsevier Interactive Patient Education 2016 [employer]  

## 2015-11-07 LAB — RPR: RPR Ser Ql: NONREACTIVE

## 2015-11-07 LAB — HIV ANTIBODY (ROUTINE TESTING W REFLEX): HIV SCREEN 4TH GENERATION: NONREACTIVE

## 2015-11-07 LAB — GC/CHLAMYDIA PROBE AMP (~~LOC~~) NOT AT ARMC
CHLAMYDIA, DNA PROBE: NEGATIVE
Neisseria Gonorrhea: NEGATIVE

## 2015-11-14 ENCOUNTER — Inpatient Hospital Stay (HOSPITAL_COMMUNITY)
Admission: AD | Admit: 2015-11-14 | Discharge: 2015-11-14 | Disposition: A | Discharge status: Home / Self Care | Payer: Medicaid Other | Source: Ambulatory Visit | Attending: Obstetrics and Gynecology | Admitting: Obstetrics and Gynecology

## 2015-11-14 ENCOUNTER — Encounter (HOSPITAL_COMMUNITY): Payer: Self-pay | Admitting: *Deleted

## 2015-11-14 DIAGNOSIS — O039 Complete or unspecified spontaneous abortion without complication: Secondary | ICD-10-CM | POA: Insufficient documentation

## 2015-11-14 DIAGNOSIS — D62 Acute posthemorrhagic anemia: Secondary | ICD-10-CM

## 2015-11-14 DIAGNOSIS — O034 Incomplete spontaneous abortion without complication: Secondary | ICD-10-CM

## 2015-11-14 LAB — CBC
HCT: 26.3 % — ABNORMAL LOW (ref 36.0–46.0)
Hemoglobin: 8.8 g/dL — ABNORMAL LOW (ref 12.0–15.0)
MCH: 29.1 pg (ref 26.0–34.0)
MCHC: 33.5 g/dL (ref 30.0–36.0)
MCV: 87.1 fL (ref 78.0–100.0)
PLATELETS: 237 10*3/uL (ref 150–400)
RBC: 3.02 MIL/uL — ABNORMAL LOW (ref 3.87–5.11)
RDW: 14 % (ref 11.5–15.5)
WBC: 5.7 10*3/uL (ref 4.0–10.5)

## 2015-11-14 LAB — URINALYSIS, ROUTINE W REFLEX MICROSCOPIC
BILIRUBIN URINE: NEGATIVE
Glucose, UA: NEGATIVE mg/dL
Ketones, ur: NEGATIVE mg/dL
Nitrite: NEGATIVE
Protein, ur: NEGATIVE mg/dL
Specific Gravity, Urine: 1.01 (ref 1.005–1.030)
pH: 7.5 (ref 5.0–8.0)

## 2015-11-14 LAB — URINE MICROSCOPIC-ADD ON

## 2015-11-14 LAB — HCG, QUANTITATIVE, PREGNANCY: hCG, Beta Chain, Quant, S: 344 m[IU]/mL — ABNORMAL HIGH (ref <5)

## 2015-11-14 MED ORDER — KETOROLAC TROMETHAMINE 60 MG/2ML IM SOLN: 60.0000 mg | INTRAMUSCULAR | Status: DC

## 2015-11-14 MED ORDER — LACTATED RINGERS IV BOLUS (SEPSIS): 1000.0000 mL | Freq: Once | INTRAVENOUS | Status: DC

## 2015-11-14 MED ORDER — FERROUS SULFATE 325 (65 FE) MG PO TABS: 325.0000 mg | ORAL_TABLET | Freq: Every day | ORAL | Status: DC

## 2015-11-14 MED ORDER — AMOXICILLIN-POT CLAVULANATE 875-125 MG PO TABS
1.0000 | ORAL_TABLET | Freq: Two times a day (BID) | ORAL | Status: DC
  Administered 2015-11-14: 1 via ORAL
  Filled 2015-11-14 (×2): qty 1

## 2015-11-14 MED ORDER — KETOROLAC TROMETHAMINE 30 MG/ML IJ SOLN
30.0000 mg | Freq: Once | INTRAMUSCULAR | Status: AC
  Administered 2015-11-14: 30 mg via INTRAVENOUS
  Filled 2015-11-14: qty 1

## 2015-11-14 MED ORDER — LACTATED RINGERS IV BOLUS (SEPSIS)
1000.0000 mL | Freq: Once | INTRAVENOUS | Status: AC
  Administered 2015-11-14: 1000 mL via INTRAVENOUS

## 2015-11-14 NOTE — MAU Provider Note (Addendum)
Chief Complaint: Dizziness and Fatigue   First Provider Initiated Contact with Patient 11/14/15 2019      SUBJECTIVE HPI: Wanda Norton is a 30 y.o. [id number] s/p Cytotec for blighted ovum who presents to maternity admissions reporting dizziness and fatigue x 2-3 days and vaginal odor with daily bleeding since 3/28 when she was given Cytotec.  She reports the dizziness is with standing and improves when lying down.  Her dizziness is unchanged since onset.  She also reports she feels tissue inside her vagina. She mentions that she had a Mirena IUD but it was not seen on her [place] on 3/28. She does not have nausea, vomiting or fever/chills.      HPI  Past Medical History  Diagnosis Date  . Medical history non-contributory   . Headache    Past Surgical History  Procedure Laterality Date  . No past surgeries     Social History   Social History  . Marital Status: Married    Spouse Name: N/A  . Number of Children: N/A  . Years of Education: N/A   Occupational History  . Not on file.   Social History Main Topics  . Smoking status: Never Smoker   . Smokeless tobacco: Never Used  . Alcohol Use: No  . Drug Use: No  . Sexual Activity: Yes    Birth Control/ Protection: None, IUD     Comment: Mirena x46months   Other Topics Concern  . Not on file   Social History Narrative   No current facility-administered medications on file prior to encounter.   Current Outpatient Prescriptions on File Prior to Encounter  Medication Sig Dispense Refill  . oxyCODONE-acetaminophen (PERCOCET/ROXICET) 5-325 MG tablet Take 1-2 tablets by mouth every 6 (six) hours as needed. 20 tablet 0  . misoprostol (CYTOTEC) 200 MCG tablet Insert four tablets vaginally the night prior to your appointment (Patient not taking: Reported on 11/14/2015) 4 tablet 1  . promethazine (PHENERGAN) 25 MG tablet Take 1 tablet (25 mg total) by mouth every 6 (six) hours as needed for nausea or vomiting. (Patient not taking: Reported  on 11/14/2015) 30 tablet 0  . promethazine (PHENERGAN) 25 MG tablet Take 1 tablet (25 mg total) by mouth every 6 (six) hours as needed for nausea or vomiting. (Patient not taking: Reported on 11/14/2015) 30 tablet 2   No Known Allergies  ROS:  Review of Systems  Constitutional: Positive for fatigue. Negative for fever and chills.  Respiratory: Negative for shortness of breath.   Cardiovascular: Negative for chest pain.  Genitourinary: Positive for vaginal bleeding and pelvic pain. Negative for dysuria, flank pain, vaginal discharge, difficulty urinating and vaginal pain.  Neurological: Positive for dizziness. Negative for headaches.  Psychiatric/Behavioral: Negative.      I have reviewed patient's Past Medical Hx, Surgical Hx, Family Hx, Social Hx, medications and allergies.   Physical Exam   Patient Vitals for the past 24 hrs:  BP Temp Temp src Pulse Resp Weight  11/14/15 1811 108/70 mmHg 98.6 F (37 C) Oral 85 16 158 lb 6.4 oz (71.85 kg)   Constitutional: Well-developed, well-nourished female in no acute distress.  Cardiovascular: normal rate Respiratory: normal effort GI: Abd soft, non-tender. Pos BS x 4 MS: Extremities nontender, no edema, normal ROM Neurologic: Alert and oriented x 4.  GU: Neg CVAT.  PELVIC EXAM: Large grey soft mass visible protruding from cervical os, consistent with products of conception.  Attempted to remove POCs from cervix with ring forceps but unsuccessful.  Bimanual  exam: Products of conception palpable protruding from os, but again, unable to remove.    Dr Emelda Fear to room, POCs removed with ring forceps inserted gently into cervix and into uterus. Pt tolerated well. Bedside US showed no evidence of POCs or IUD immediately following exam.      LAB RESULTS Results for orders placed or performed during the hospital encounter of 11/14/15 (from the past 24 hour(s))  Urinalysis, Routine w reflex microscopic (not at [facility])     Status: Abnormal    Collection Time: 11/14/15  6:13 PM  Result Value Ref Range   Color, Urine YELLOW YELLOW   APPearance HAZY (A) CLEAR   Specific Gravity, Urine 1.010 1.005 - 1.030   pH 7.5 5.0 - 8.0   Glucose, UA NEGATIVE NEGATIVE mg/dL   Hgb urine dipstick LARGE (A) NEGATIVE   Bilirubin Urine NEGATIVE NEGATIVE   Ketones, ur NEGATIVE NEGATIVE mg/dL   Protein, ur NEGATIVE NEGATIVE mg/dL   Nitrite NEGATIVE NEGATIVE   Leukocytes, UA SMALL (A) NEGATIVE  Urine microscopic-add on     Status: Abnormal   Collection Time: 11/14/15  6:13 PM  Result Value Ref Range   Squamous Epithelial / LPF 0-5 (A) NONE SEEN   WBC, UA 0-5 0 - 5 WBC/hpf   RBC / HPF 6-30 0 - 5 RBC/hpf   Bacteria, UA FEW (A) NONE SEEN  CBC     Status: Abnormal   Collection Time: 11/14/15  6:27 PM  Result Value Ref Range   WBC 5.7 4.0 - 10.5 K/uL   RBC 3.02 (L) 3.87 - 5.11 MIL/uL   Hemoglobin 8.8 (L) 12.0 - 15.0 g/dL   HCT 40.9 (L) 81.1 - 91.4 %   MCV 87.1 78.0 - 100.0 fL   MCH 29.1 26.0 - 34.0 pg   MCHC 33.5 30.0 - 36.0 g/dL   RDW 78.2 95.6 - 21.3 %   Platelets 237 150 - 400 K/uL  hCG, quantitative, pregnancy     Status: Abnormal   Collection Time: 11/14/15  6:27 PM  Result Value Ref Range   hCG, Beta Chain, Quant, S 344 (H) <5 mIU/mL       IMAGING US Ob Transvaginal  11/06/2015  ADDENDUM REPORT: 11/06/2015 12:04 ADDENDUM: Patient has a prior ultrasound dated 09/29/2015. By current ultrasound dates, it was unclear that this reflects the same pregnancy, and therefore comparison to that ultrasound was not provided. If this in fact reflects the same a gestational sac that was noted on the prior, than this suggests inappropriate growth of the gestational sac, and an abnormal crown-rump. In that clinical setting, these findings would be compatible with failed pregnancy. However, if this reflects a new pregnancy from 09/29/2015, the original interpretation is still valid. Correlate with clinical history and serial beta HCG (if available) to  determine whether this is likely to be the same gestational sac that was visualized on 09/29/2015. Electronically Signed   By: Charline Bills M.D.   On: 11/06/2015 12:04  11/06/2015  CLINICAL DATA:  Pregnant, vaginal bleeding EXAM: TRANSVAGINAL OB ULTRASOUND TECHNIQUE: Transvaginal ultrasound was performed for complete evaluation of the gestation as well as the maternal uterus, adnexal regions, and pelvic cul-de-sac. COMPARISON:  None. FINDINGS: Intrauterine gestational sac: Visualized/normal in shape. Yolk sac:  Present Embryo:  Suspected Cardiac Activity: Not visualized MSD: 19.5  mm   6 w   6  d CRL:   4.8  mm   6 w 1 d  US EDC: 06/30/2016 Subchorionic hemorrhage:  Large subchronic hemorrhage. Maternal uterus/adnexae: Bilateral ovaries are within normal limits. No free fluid IMPRESSION: Single intrauterine gestation with yolk sac and a suspected small fetal pole, measuring 6 weeks 1 day by crown-rump length. No definite cardiac activity is visualized, although this may be due to small size/poor visualization. Serial beta HCG is suggested. Follow-up pelvic ultrasound is suggested in 10-14 days to confirm viability. Electronically Signed: By: Charline BillsSriyesh  Krishnan M.D. On: 11/06/2015 11:04    MAU Management/MDM: Ordered labs and reviewed results.  Consult Dr Emelda FearFerguson.  Give Augmentin 875 here in MAU and Toradol 60 mg IM x 1.  D/C home with plan to f/u in clinic in 1 week.  Pt stable at time of discharge.  ASSESSMENT 1. Retained products of conception w/o hemorrhage but w other compl   2. Incomplete miscarriage     PLAN Discharge home   Medication List    ASK your doctor about these medications        misoprostol 200 MCG tablet  Commonly known as:  CYTOTEC  Insert four tablets vaginally the night prior to your appointment     oxyCODONE-acetaminophen 5-325 MG tablet  Commonly known as:  PERCOCET/ROXICET  Take 1-2 tablets by mouth every 6 (six) hours as needed.      promethazine 25 MG tablet  Commonly known as:  PHENERGAN  Take 1 tablet (25 mg total) by mouth every 6 (six) hours as needed for nausea or vomiting.     promethazine 25 MG tablet  Commonly known as:  PHENERGAN  Take 1 tablet (25 mg total) by mouth every 6 (six) hours as needed for nausea or vomiting.       Follow-up Information    [facility].   Specialty:  Obstetrics and Gynecology   Why:  The clinic will call you with appointment., Return to MAU as needed for emergencies   Contact information:   [address] [place] [id number] [phone]      Sharen CounterLisa Leftwich-Kirby Certified Nurse-Midwife 11/14/2015  9:34 PM

## 2015-11-14 NOTE — Discharge Instructions (Signed)
Incomplete Miscarriage A miscarriage is the sudden loss of an unborn baby (fetus) before the 20th week of pregnancy. In an incomplete miscarriage, parts of the fetus or placenta (afterbirth) remain in the body.  Having a miscarriage can be an emotional experience. Talk with your health care provider about any questions you may have about miscarrying, the grieving process, and your future pregnancy plans. CAUSES   Problems with the fetal chromosomes that make it impossible for the baby to develop normally. Problems with the baby's genes or chromosomes are most often the result of errors that occur by chance as the embryo divides and grows. The problems are not inherited from the parents.  Infection of the cervix or uterus.  Hormone problems.  Problems with the cervix, such as having an incompetent cervix. This is when the tissue in the cervix is not strong enough to hold the pregnancy.  Problems with the uterus, such as an abnormally shaped uterus, uterine fibroids, or congenital abnormalities.  Certain medical conditions.  Smoking, drinking alcohol, or taking illegal drugs.  Trauma. SYMPTOMS   Vaginal bleeding or spotting, with or without cramps or pain.  Pain or cramping in the abdomen or lower back.  Passing fluid, tissue, or blood clots from the vagina. DIAGNOSIS  Your health care provider will perform a physical exam. You may also have an ultrasound to confirm the miscarriage. Blood or urine tests may also be ordered. TREATMENT   Usually, a dilation and curettage (D&C) procedure is performed. During a D&C procedure, the cervix is widened (dilated) and any remaining fetal or placental tissue is gently removed from the uterus.  Antibiotic medicines are prescribed if there is an infection. Other medicines may be given to reduce the size of the uterus (contract) if there is a lot of bleeding.  If you have Rh negative blood and your baby was Rh positive, you will need a Rho (D)  immune globulin shot. This shot will protect any future baby from having Rh blood problems in future pregnancies.  You may be confined to bed rest. This means you should stay in bed and only get up to use the bathroom. HOME CARE INSTRUCTIONS   Rest as directed by your health care provider.  Restrict activity as directed by your health care provider. You may be allowed to continue light activity if curettage was not done but you require further treatment.  Keep track of the number of pads you use each day. Keep track of how soaked (saturated) they are. Record this information.  Do not  use tampons.  Do not douche or have sexual intercourse until approved by your health care provider.  Keep all follow-up appointments for reevaluation and continuing management.  Only take over-the-counter or prescription medicines for pain, fever, or discomfort as directed by your health care provider.  Take antibiotic medicine as directed by your health care provider. Make sure you finish it even if you start to feel better. SEEK IMMEDIATE MEDICAL CARE IF:   You experience severe cramps in your stomach, back, or abdomen.  You have an unexplained temperature (make sure to record these temperatures).  You pass large clots or tissue (save these for your health care provider to inspect).  Your bleeding increases.  You become light-headed, weak, or have fainting episodes. MAKE SURE YOU:   Understand these instructions.  Will watch your condition.  Will get help right away if you are not doing well or get worse.   This information is not intended to   replace advice given to you by your health care provider. Make sure you discuss any questions you have with your health care provider.   Document Released: 07/28/2005 Document Revised: 08/18/2014 Document Reviewed: 02/24/2013 Elsevier Interactive Patient Education 2016 [employer]  

## 2015-11-14 NOTE — MAU Note (Addendum)
Recent miscarriage, was given cytotec.  Feels sick, dizzy, still bleeding. Odor noted. Is tired.  Can feel something in her vagina.

## 2015-11-22 ENCOUNTER — Encounter: Payer: 59 | Admitting: Family Medicine

## 2015-11-28 ENCOUNTER — Encounter: Payer: 59 | Admitting: Obstetrics & Gynecology

## 2016-03-10 LAB — OB RESULTS CONSOLE RPR: RPR: NONREACTIVE

## 2016-03-10 LAB — OB RESULTS CONSOLE GC/CHLAMYDIA
Chlamydia: NEGATIVE
GC PROBE AMP, GENITAL: NEGATIVE

## 2016-03-10 LAB — OB RESULTS CONSOLE ABO/RH: RH TYPE: POSITIVE

## 2016-03-10 LAB — OB RESULTS CONSOLE HEPATITIS B SURFACE ANTIGEN: HEP B S AG: NEGATIVE

## 2016-03-10 LAB — OB RESULTS CONSOLE HIV ANTIBODY (ROUTINE TESTING): HIV: NONREACTIVE

## 2016-03-10 LAB — OB RESULTS CONSOLE ANTIBODY SCREEN: Antibody Screen: NEGATIVE

## 2016-03-10 LAB — OB RESULTS CONSOLE RUBELLA ANTIBODY, IGM: RUBELLA: IMMUNE

## 2016-06-11 ENCOUNTER — Inpatient Hospital Stay (HOSPITAL_COMMUNITY)
Admission: AD | Admit: 2016-06-11 | Discharge: 2016-06-11 | Disposition: A | Discharge status: Home / Self Care | Payer: Medicaid Other | Source: Ambulatory Visit | Attending: Obstetrics & Gynecology | Admitting: Obstetrics & Gynecology

## 2016-06-11 ENCOUNTER — Encounter (HOSPITAL_COMMUNITY): Payer: Self-pay | Admitting: *Deleted

## 2016-06-11 DIAGNOSIS — R1010 Upper abdominal pain, unspecified: Secondary | ICD-10-CM | POA: Diagnosis not present

## 2016-06-11 DIAGNOSIS — O99612 Diseases of the digestive system complicating pregnancy, second trimester: Secondary | ICD-10-CM | POA: Insufficient documentation

## 2016-06-11 DIAGNOSIS — J029 Acute pharyngitis, unspecified: Secondary | ICD-10-CM | POA: Diagnosis present

## 2016-06-11 DIAGNOSIS — K219 Gastro-esophageal reflux disease without esophagitis: Secondary | ICD-10-CM

## 2016-06-11 DIAGNOSIS — O9989 Other specified diseases and conditions complicating pregnancy, childbirth and the puerperium: Secondary | ICD-10-CM | POA: Diagnosis not present

## 2016-06-11 DIAGNOSIS — Z3A26 26 weeks gestation of pregnancy: Secondary | ICD-10-CM | POA: Insufficient documentation

## 2016-06-11 LAB — URINALYSIS, ROUTINE W REFLEX MICROSCOPIC
Bilirubin Urine: NEGATIVE
GLUCOSE, UA: NEGATIVE mg/dL
HGB URINE DIPSTICK: NEGATIVE
Ketones, ur: NEGATIVE mg/dL
Nitrite: NEGATIVE
Protein, ur: NEGATIVE mg/dL
pH: 7.5 (ref 5.0–8.0)

## 2016-06-11 LAB — URINE MICROSCOPIC-ADD ON
RBC / HPF: NONE SEEN RBC/hpf (ref 0–5)
WBC, UA: NONE SEEN WBC/hpf (ref 0–5)

## 2016-06-11 MED ORDER — METOCLOPRAMIDE HCL 10 MG PO TABS
10.0000 mg | ORAL_TABLET | Freq: Once | ORAL | Status: AC
  Administered 2016-06-11: 10 mg via ORAL
  Filled 2016-06-11: qty 1

## 2016-06-11 MED ORDER — GI COCKTAIL ~~LOC~~
30.0000 mL | Freq: Once | ORAL | Status: AC
  Administered 2016-06-11: 30 mL via ORAL
  Filled 2016-06-11: qty 30

## 2016-06-11 MED ORDER — PANTOPRAZOLE SODIUM 40 MG PO TBEC: 40.0000 mg | DELAYED_RELEASE_TABLET | Freq: Every day | ORAL | 2 refills | Status: DC

## 2016-06-11 NOTE — MAU Provider Note (Signed)
History     CSN: [id number]  Arrival date and time: 06/11/16 1656   First Provider Initiated Contact with Patient 06/11/16 1747      Chief Complaint  Patient presents with  . Sore Throat   HPI   Wanda Norton is a 30 y.o. female [phone] @ 8053w2d here in MAU with pain in her throat. " I feel like I swallowed a chicken bone". " I feel like I have something stuck in my throat". The pain started back in April; prior to pregnancy. . She has never been seen by a Dr. For this, and has never mentioned this at any of her visits. The pain worsened last night. Last night for dinner she ate oatmeal and the pain worsened after that. The pain is sharp and worsens when she lays flat. When she breathes in she feels it in the top of her stomach. The pain occurs almost every single day.  Patient denies NSAID use, or history of alcohol use.   OB History    Gravida Para Term Preterm AB Living   4 2 2  0 1 2   SAB TAB Ectopic Multiple Live Births   1 0 0 0 2      Past Medical History:  Diagnosis Date  . Headache   . Medical history non-contributory     Past Surgical History:  Procedure Laterality Date  . NO PAST SURGERIES      Family History  Problem Relation Age of Onset  . Alcohol abuse Neg Hx   . Arthritis Neg Hx   . Asthma Neg Hx   . Birth defects Neg Hx   . Cancer Neg Hx   . COPD Neg Hx   . Depression Neg Hx   . Diabetes Neg Hx   . Drug abuse Neg Hx   . Early death Neg Hx   . Hearing loss Neg Hx   . Heart disease Neg Hx   . Hyperlipidemia Neg Hx   . Hypertension Neg Hx   . Kidney disease Neg Hx   . Learning disabilities Neg Hx   . Mental illness Neg Hx   . Mental retardation Neg Hx   . Miscarriages / Stillbirths Neg Hx   . Stroke Neg Hx   . Vision loss Neg Hx   . Varicose Veins Neg Hx     Social History  Substance Use Topics  . Smoking status: Never Smoker  . Smokeless tobacco: Never Used  . Alcohol use No    Allergies: No Known Allergies  No prescriptions  prior to admission.   Results for orders placed or performed during the hospital encounter of 06/11/16 (from the past 48 hour(s))  Urinalysis, Routine w reflex microscopic (not at [facility])     Status: Abnormal   Collection Time: 06/11/16  5:14 PM  Result Value Ref Range   Color, Urine YELLOW YELLOW   APPearance CLEAR CLEAR   Specific Gravity, Urine <1.005 (L) 1.005 - 1.030   pH 7.5 5.0 - 8.0   Glucose, UA NEGATIVE NEGATIVE mg/dL   Hgb urine dipstick NEGATIVE NEGATIVE   Bilirubin Urine NEGATIVE NEGATIVE   Ketones, ur NEGATIVE NEGATIVE mg/dL   Protein, ur NEGATIVE NEGATIVE mg/dL   Nitrite NEGATIVE NEGATIVE   Leukocytes, UA SMALL (A) NEGATIVE  Urine microscopic-add on     Status: Abnormal   Collection Time: 06/11/16  5:14 PM  Result Value Ref Range   Squamous Epithelial / LPF 0-5 (A) NONE SEEN   WBC, UA  NONE SEEN 0 - 5 WBC/hpf   RBC / HPF NONE SEEN 0 - 5 RBC/hpf   Bacteria, UA RARE (A) NONE SEEN   Review of Systems  Constitutional: Negative for chills and fever.  Gastrointestinal: Negative for constipation, diarrhea, nausea and vomiting.   Physical Exam   Blood pressure 109/71, pulse 85, temperature 98.8 F (37.1 C), temperature source Oral, resp. rate 18, last menstrual period 12/10/2015, SpO2 100 %, unknown if currently breastfeeding.  Physical Exam  Constitutional: She is oriented to person, place, and time. She appears well-developed and well-nourished.  Non-toxic appearance. She does not have a sickly appearance. She does not appear ill. No distress.  HENT:  Head: Normocephalic.  Respiratory: She exhibits tenderness. She exhibits no mass.    Tenderness with palpation from sternal notch to the top of her xyphoid process.   Musculoskeletal: Normal range of motion.  Neurological: She is alert and oriented to person, place, and time.  Skin: Skin is warm. She is not diaphoretic.  Psychiatric: Her behavior is normal.   Fetal Tracing: Baseline: 130 bpm  Variability: Moderate    Accelerations: 15x15 Decelerations: None Toco: quiet   MAU Course  Procedures  None  MDM Gi cocktail  Reglan 10 mg PO  Discussed patient with Dr. Despina HiddenEure    Assessment and Plan   A:  1. Gastroesophageal reflux disease, esophagitis presence not specified   2. Upper abdominal pain     P:  Discharge home in stable condition Rx: Protonix.  If symptoms persist, without relief, patient to follow up with GI Discussed GERD diet   Duane LopeJennifer I Nayel Purdy, NP 06/11/2016 8:28 PM

## 2016-06-11 NOTE — MAU Note (Signed)
Pt presents to MAU with complaints of pain in her throat that started months ago. PT states the pain got worse last night.

## 2016-06-11 NOTE — Discharge Instructions (Signed)
Food Choices for Gastroesophageal Reflux Disease, Adult °When you have gastroesophageal reflux disease (GERD), the foods you eat and your eating habits are very important. Choosing the right foods can help ease the discomfort of GERD. °WHAT GENERAL GUIDELINES DO I NEED TO FOLLOW? °· Choose fruits, vegetables, whole grains, low-fat dairy products, and low-fat meat, fish, and poultry. °· Limit fats such as oils, salad dressings, butter, nuts, and avocado. °· Keep a food diary to identify foods that cause symptoms. °· Avoid foods that cause reflux. These may be different for different people. °· Eat frequent small meals instead of three large meals each day. °· Eat your meals slowly, in a relaxed setting. °· Limit fried foods. °· Cook foods using methods other than frying. °· Avoid drinking alcohol. °· Avoid drinking large amounts of liquids with your meals. °· Avoid bending over or lying down until 2-3 hours after eating. °WHAT FOODS ARE NOT RECOMMENDED? °The following are some foods and drinks that may worsen your symptoms: °Vegetables °Tomatoes. Tomato juice. Tomato and spaghetti sauce. Chili peppers. Onion and garlic. Horseradish. °Fruits °Oranges, grapefruit, and lemon (fruit and juice). °Meats °High-fat meats, fish, and poultry. This includes hot dogs, ribs, ham, sausage, salami, and bacon. °Dairy °Whole milk and chocolate milk. Sour cream. Cream. Butter. Ice cream. Cream cheese.  °Beverages °Coffee and tea, with or without caffeine. Carbonated beverages or energy drinks. °Condiments °Hot sauce. Barbecue sauce.  °Sweets/Desserts °Chocolate and cocoa. Donuts. Peppermint and spearmint. °Fats and Oils °High-fat foods, including French fries and potato chips. °Other °Vinegar. Strong spices, such as black pepper, white pepper, red pepper, cayenne, curry powder, cloves, ginger, and chili powder. °The items listed above may not be a complete list of foods and beverages to avoid. Contact your dietitian for more  information. °  °This information is not intended to replace advice given to you by your health care provider. Make sure you discuss any questions you have with your health care provider. °  °Document Released: 07/28/2005 Document Revised: 08/18/2014 Document Reviewed: 06/01/2013 °Elsevier Interactive Patient Education ©2016 Elsevier Inc[employer] ° °Gastroesophageal Reflux Disease, Adult °Normally, food travels down the esophagus and stays in the stomach to be digested. However, when a person has gastroesophageal reflux disease (GERD), food and stomach acid move back up into the esophagus. When this happens, the esophagus becomes sore and inflamed. Over time, GERD can create small holes (ulcers) in the lining of the esophagus.  °CAUSES °This condition is caused by a problem with the muscle between the esophagus and the stomach (lower esophageal sphincter, or LES). Normally, the LES muscle closes after food passes through the esophagus to the stomach. When the LES is weakened or abnormal, it does not close properly, and that allows food and stomach acid to go back up into the esophagus. The LES can be weakened by certain dietary substances, medicines, and medical conditions, including: °· Tobacco use. °· Pregnancy. °· Having a hiatal hernia. °· Heavy alcohol use. °· Certain foods and beverages, such as coffee, chocolate, onions, and peppermint. °RISK FACTORS °This condition is more likely to develop in: °· People who have an increased body weight. °· People who have connective tissue disorders. °· People who use NSAID medicines. °SYMPTOMS °Symptoms of this condition include: °· Heartburn. °· Difficult or painful swallowing. °· The feeling of having a lump in the throat. °· A bitter taste in the mouth. °· Bad breath. °· Having a large amount of saliva. °· Having an upset or bloated stomach. °· Belching. °· Chest pain. °·   Shortness of breath or wheezing. °· Ongoing (chronic) cough or a night-time cough. °· Wearing away of  tooth enamel. °· Weight loss. °Different conditions can cause chest pain. Make sure to see your health care provider if you experience chest pain. °DIAGNOSIS °Your health care provider will take a medical history and perform a physical exam. To determine if you have mild or severe GERD, your health care provider may also monitor how you respond to treatment. You may also have other tests, including: °· An endoscopy to examine your stomach and esophagus with a small camera. °· A test that measures the acidity level in your esophagus. °· A test that measures how much pressure is on your esophagus. °· A barium swallow or modified barium swallow to show the shape, size, and functioning of your esophagus. °TREATMENT °The goal of treatment is to help relieve your symptoms and to prevent complications. Treatment for this condition may vary depending on how severe your symptoms are. Your health care provider may recommend: °· Changes to your diet. °· Medicine. °· Surgery. °HOME CARE INSTRUCTIONS °Diet °· Follow a diet as recommended by your health care provider. This may involve avoiding foods and drinks such as: °¨ Coffee and tea (with or without caffeine). °¨ Drinks that contain alcohol. °¨ Energy drinks and sports drinks. °¨ Carbonated drinks or sodas. °¨ Chocolate and cocoa. °¨ Peppermint and mint flavorings. °¨ Garlic and onions. °¨ Horseradish. °¨ Spicy and acidic foods, including peppers, chili powder, curry powder, vinegar, hot sauces, and barbecue sauce. °¨ Citrus fruit juices and citrus fruits, such as oranges, lemons, and limes. °¨ Tomato-based foods, such as red sauce, chili, salsa, and pizza with red sauce. °¨ Fried and fatty foods, such as donuts, french fries, potato chips, and high-fat dressings. °¨ High-fat meats, such as hot dogs and fatty cuts of red and white meats, such as rib eye steak, sausage, ham, and bacon. °¨ High-fat dairy items, such as whole milk, butter, and cream cheese. °· Eat small,  frequent meals instead of large meals. °· Avoid drinking large amounts of liquid with your meals. °· Avoid eating meals during the 2-3 hours before bedtime. °· Avoid lying down right after you eat. °· Do not exercise right after you eat. ° General Instructions  °· Pay attention to any changes in your symptoms. °· Take over-the-counter and prescription medicines only as told by your health care provider. Do not take aspirin, ibuprofen, or other NSAIDs unless your health care provider told you to do so. °· Do not use any tobacco products, including cigarettes, chewing tobacco, and e-cigarettes. If you need help quitting, ask your health care provider. °· Wear loose-fitting clothing. Do not wear anything tight around your waist that causes pressure on your abdomen. °· Raise (elevate) the head of your bed 6 inches (15cm). °· Try to reduce your stress, such as with yoga or meditation. If you need help reducing stress, ask your health care provider. °· If you are overweight, reduce your weight to an amount that is healthy for you. Ask your health care provider for guidance about a safe weight loss goal. °· Keep all follow-up visits as told by your health care provider. This is important. °SEEK MEDICAL CARE IF: °· You have new symptoms. °· You have unexplained weight loss. °· You have difficulty swallowing, or it hurts to swallow. °· You have wheezing or a persistent cough. °· Your symptoms do not improve with treatment. °· You have a hoarse voice. °SEEK IMMEDIATE MEDICAL CARE IF: °· You have pain   in your arms, neck, jaw, teeth, or back. °· You feel sweaty, dizzy, or light-headed. °· You have chest pain or shortness of breath. °· You vomit and your vomit looks like blood or coffee grounds. °· You faint. °· Your stool is bloody or black. °· You cannot swallow, drink, or eat. °  °This information is not intended to replace advice given to you by your health care provider. Make sure you discuss any questions you have with  your health care provider. °  °Document Released: 05/07/2005 Document Revised: 04/18/2015 Document Reviewed: 11/22/2014 °Elsevier Interactive Patient Education ©2016 Elsevier Inc. ° °

## 2016-08-11 NOTE — L&D Delivery Note (Signed)
31 y.o. [id number] at 3268w1d delivered a viable female infant in cephalic, LOA position. Loose nuchal cord x1, easily reduced at perineum. Right anterior shoulder delivered with ease. 60 sec delayed cord clamping. Cord clamped x2 and cut. Placenta delivered spontaneously intact, with 3VC. Fundus firm on exam with massage and pitocin. Good hemostasis noted.  Anesthesia: Epidural Laceration: None Suture: N/A Good hemostasis noted. EBL: 150 cc  Mom and baby recovering in LDR.    Apgars: APGAR (1 MIN): 8   APGAR (5 MINS): 9    Weight: Pending skin to skin    Jen MowElizabeth Mumaw, DO OB Fellow [facility], [facility] 09/23/2016, 9:10 PM

## 2016-08-21 LAB — OB RESULTS CONSOLE GC/CHLAMYDIA
CHLAMYDIA, DNA PROBE: NEGATIVE
Gonorrhea: NEGATIVE

## 2016-08-21 LAB — OB RESULTS CONSOLE GBS: GBS: NEGATIVE

## 2016-09-15 ENCOUNTER — Encounter (HOSPITAL_COMMUNITY): Payer: Self-pay | Admitting: *Deleted

## 2016-09-15 ENCOUNTER — Telehealth (HOSPITAL_COMMUNITY): Payer: Self-pay | Admitting: *Deleted

## 2016-09-15 NOTE — Telephone Encounter (Signed)
Preadmission screen Interpreter number [phone]

## 2016-09-22 MED ORDER — FENTANYL CITRATE (PF) 100 MCG/2ML IJ SOLN
100.0000 ug | INTRAMUSCULAR | Status: DC | PRN
  Administered 2016-09-23: 100 ug via INTRAVENOUS
  Filled 2016-09-22: qty 2

## 2016-09-23 ENCOUNTER — Inpatient Hospital Stay (HOSPITAL_COMMUNITY): Payer: Medicaid Other | Admitting: Anesthesiology

## 2016-09-23 ENCOUNTER — Encounter (HOSPITAL_COMMUNITY): Payer: Self-pay

## 2016-09-23 ENCOUNTER — Inpatient Hospital Stay (HOSPITAL_COMMUNITY)
Admission: RE | Admit: 2016-09-23 | Discharge: 2016-09-25 | DRG: 775 | Disposition: A | Discharge status: Home / Self Care | Payer: Medicaid Other | Source: Ambulatory Visit | Attending: Obstetrics & Gynecology | Admitting: Obstetrics & Gynecology

## 2016-09-23 DIAGNOSIS — R1912 Hyperactive bowel sounds: Secondary | ICD-10-CM | POA: Diagnosis not present

## 2016-09-23 DIAGNOSIS — Z3490 Encounter for supervision of normal pregnancy, unspecified, unspecified trimester: Secondary | ICD-10-CM

## 2016-09-23 DIAGNOSIS — O480 Post-term pregnancy: Principal | ICD-10-CM | POA: Diagnosis present

## 2016-09-23 DIAGNOSIS — Z3A41 41 weeks gestation of pregnancy: Secondary | ICD-10-CM

## 2016-09-23 LAB — CBC
HCT: 36.1 % (ref 36.0–46.0)
Hemoglobin: 12.1 g/dL (ref 12.0–15.0)
MCH: 29.9 pg (ref 26.0–34.0)
MCHC: 33.5 g/dL (ref 30.0–36.0)
MCV: 89.1 fL (ref 78.0–100.0)
PLATELETS: 217 10*3/uL (ref 150–400)
RBC: 4.05 MIL/uL (ref 3.87–5.11)
RDW: 16 % — ABNORMAL HIGH (ref 11.5–15.5)
WBC: 7.5 10*3/uL (ref 4.0–10.5)

## 2016-09-23 LAB — TYPE AND SCREEN
ABO/RH(D): O POS
ANTIBODY SCREEN: NEGATIVE

## 2016-09-23 LAB — ABO/RH: ABO/RH(D): O POS

## 2016-09-23 MED ORDER — DIPHENHYDRAMINE HCL 50 MG/ML IJ SOLN: 12.5000 mg | INTRAMUSCULAR | Status: DC | PRN

## 2016-09-23 MED ORDER — LACTATED RINGERS IV SOLN: 500.0000 mL | Freq: Once | INTRAVENOUS | Status: DC

## 2016-09-23 MED ORDER — ZOLPIDEM TARTRATE 5 MG PO TABS: 5.0000 mg | ORAL_TABLET | Freq: Every evening | ORAL | Status: DC | PRN

## 2016-09-23 MED ORDER — LACTATED RINGERS IV SOLN
INTRAVENOUS | Status: DC
  Administered 2016-09-23 (×2): via INTRAVENOUS

## 2016-09-23 MED ORDER — ACETAMINOPHEN 325 MG PO TABS
650.0000 mg | ORAL_TABLET | ORAL | Status: DC | PRN
  Administered 2016-09-24 (×2): 650 mg via ORAL
  Filled 2016-09-23 (×2): qty 2

## 2016-09-23 MED ORDER — COCONUT OIL OIL: 1.0000 "application " | TOPICAL_OIL | Status: DC | PRN

## 2016-09-23 MED ORDER — LIDOCAINE HCL (PF) 1 % IJ SOLN
30.0000 mL | INTRAMUSCULAR | Status: DC | PRN
  Filled 2016-09-23: qty 30

## 2016-09-23 MED ORDER — SIMETHICONE 80 MG PO CHEW: 80.0000 mg | CHEWABLE_TABLET | ORAL | Status: DC | PRN

## 2016-09-23 MED ORDER — LIDOCAINE HCL (PF) 1 % IJ SOLN
INTRAMUSCULAR | Status: DC | PRN
  Administered 2016-09-23: 7 mL via EPIDURAL
  Administered 2016-09-23: 5 mL via EPIDURAL

## 2016-09-23 MED ORDER — OXYTOCIN 40 UNITS IN LACTATED RINGERS INFUSION - SIMPLE MED
2.5000 [IU]/h | INTRAVENOUS | Status: DC
  Filled 2016-09-23: qty 1000

## 2016-09-23 MED ORDER — PRENATAL MULTIVITAMIN CH
1.0000 | ORAL_TABLET | Freq: Every day | ORAL | Status: DC
  Administered 2016-09-24: 1 via ORAL
  Filled 2016-09-23: qty 1

## 2016-09-23 MED ORDER — SODIUM CHLORIDE 0.9 % IV SOLN: 250.0000 mL | INTRAVENOUS | Status: DC | PRN

## 2016-09-23 MED ORDER — FENTANYL CITRATE (PF) 100 MCG/2ML IJ SOLN
INTRAMUSCULAR | Status: DC | PRN
  Administered 2016-09-23: 100 ug via EPIDURAL

## 2016-09-23 MED ORDER — TERBUTALINE SULFATE 1 MG/ML IJ SOLN
0.2500 mg | Freq: Once | INTRAMUSCULAR | Status: DC | PRN
  Filled 2016-09-23: qty 1

## 2016-09-23 MED ORDER — ONDANSETRON HCL 4 MG/2ML IJ SOLN: 4.0000 mg | Freq: Four times a day (QID) | INTRAMUSCULAR | Status: DC | PRN

## 2016-09-23 MED ORDER — ACETAMINOPHEN 325 MG PO TABS: 650.0000 mg | ORAL_TABLET | ORAL | Status: DC | PRN

## 2016-09-23 MED ORDER — EPHEDRINE 5 MG/ML INJ
10.0000 mg | INTRAVENOUS | Status: DC | PRN
  Filled 2016-09-23: qty 4

## 2016-09-23 MED ORDER — ONDANSETRON HCL 4 MG PO TABS: 4.0000 mg | ORAL_TABLET | ORAL | Status: DC | PRN

## 2016-09-23 MED ORDER — SOD CITRATE-CITRIC ACID 500-334 MG/5ML PO SOLN: 30.0000 mL | ORAL | Status: DC | PRN

## 2016-09-23 MED ORDER — OXYTOCIN 40 UNITS IN LACTATED RINGERS INFUSION - SIMPLE MED
1.0000 m[IU]/min | INTRAVENOUS | Status: DC
  Administered 2016-09-23: 2 m[IU]/min via INTRAVENOUS

## 2016-09-23 MED ORDER — PHENYLEPHRINE 40 MCG/ML (10ML) SYRINGE FOR IV PUSH (FOR BLOOD PRESSURE SUPPORT)
80.0000 ug | PREFILLED_SYRINGE | INTRAVENOUS | Status: DC | PRN
  Filled 2016-09-23: qty 5
  Filled 2016-09-23: qty 10

## 2016-09-23 MED ORDER — BENZOCAINE-MENTHOL 20-0.5 % EX AERO
1.0000 "application " | INHALATION_SPRAY | CUTANEOUS | Status: DC | PRN
  Administered 2016-09-24: 1 via TOPICAL
  Filled 2016-09-23: qty 56

## 2016-09-23 MED ORDER — OXYTOCIN BOLUS FROM INFUSION: 500.0000 mL | Freq: Once | INTRAVENOUS | Status: DC

## 2016-09-23 MED ORDER — WITCH HAZEL-GLYCERIN EX PADS: 1.0000 "application " | MEDICATED_PAD | CUTANEOUS | Status: DC | PRN

## 2016-09-23 MED ORDER — SENNOSIDES-DOCUSATE SODIUM 8.6-50 MG PO TABS
2.0000 | ORAL_TABLET | ORAL | Status: DC
  Administered 2016-09-24 (×2): 2 via ORAL
  Filled 2016-09-23 (×2): qty 2

## 2016-09-23 MED ORDER — SODIUM CHLORIDE 0.9% FLUSH: 3.0000 mL | INTRAVENOUS | Status: DC | PRN

## 2016-09-23 MED ORDER — BUPIVACAINE HCL (PF) 0.25 % IJ SOLN
INTRAMUSCULAR | Status: DC | PRN
  Administered 2016-09-23: 8 mL via EPIDURAL

## 2016-09-23 MED ORDER — TETANUS-DIPHTH-ACELL PERTUSSIS 5-2.5-18.5 LF-MCG/0.5 IM SUSP: 0.5000 mL | Freq: Once | INTRAMUSCULAR | Status: DC

## 2016-09-23 MED ORDER — MISOPROSTOL 25 MCG QUARTER TABLET
25.0000 ug | ORAL_TABLET | ORAL | Status: DC | PRN
  Administered 2016-09-23 (×2): 25 ug via VAGINAL
  Filled 2016-09-23 (×2): qty 0.25
  Filled 2016-09-23: qty 1

## 2016-09-23 MED ORDER — ONDANSETRON HCL 4 MG/2ML IJ SOLN: 4.0000 mg | INTRAMUSCULAR | Status: DC | PRN

## 2016-09-23 MED ORDER — DIPHENHYDRAMINE HCL 25 MG PO CAPS: 25.0000 mg | ORAL_CAPSULE | Freq: Four times a day (QID) | ORAL | Status: DC | PRN

## 2016-09-23 MED ORDER — SODIUM CHLORIDE 0.9% FLUSH: 3.0000 mL | Freq: Two times a day (BID) | INTRAVENOUS | Status: DC

## 2016-09-23 MED ORDER — OXYCODONE-ACETAMINOPHEN 5-325 MG PO TABS: 2.0000 | ORAL_TABLET | ORAL | Status: DC | PRN

## 2016-09-23 MED ORDER — IBUPROFEN 600 MG PO TABS
600.0000 mg | ORAL_TABLET | Freq: Four times a day (QID) | ORAL | Status: DC
  Administered 2016-09-23 – 2016-09-25 (×7): 600 mg via ORAL
  Filled 2016-09-23 (×7): qty 1

## 2016-09-23 MED ORDER — DIBUCAINE 1 % RE OINT: 1.0000 "application " | TOPICAL_OINTMENT | RECTAL | Status: DC | PRN

## 2016-09-23 MED ORDER — LACTATED RINGERS IV SOLN: 500.0000 mL | INTRAVENOUS | Status: DC | PRN

## 2016-09-23 MED ORDER — FLEET ENEMA 7-19 GM/118ML RE ENEM: 1.0000 | ENEMA | RECTAL | Status: DC | PRN

## 2016-09-23 MED ORDER — PHENYLEPHRINE 40 MCG/ML (10ML) SYRINGE FOR IV PUSH (FOR BLOOD PRESSURE SUPPORT)
80.0000 ug | PREFILLED_SYRINGE | INTRAVENOUS | Status: DC | PRN
  Filled 2016-09-23: qty 5

## 2016-09-23 MED ORDER — FENTANYL CITRATE (PF) 100 MCG/2ML IJ SOLN
INTRAMUSCULAR | Status: AC
  Filled 2016-09-23: qty 2

## 2016-09-23 MED ORDER — OXYCODONE-ACETAMINOPHEN 5-325 MG PO TABS
1.0000 | ORAL_TABLET | ORAL | Status: DC | PRN
Start: 2016-09-23 — End: 2016-09-23

## 2016-09-23 MED ORDER — FENTANYL 2.5 MCG/ML BUPIVACAINE 1/10 % EPIDURAL INFUSION (WH - ANES)
14.0000 mL/h | INTRAMUSCULAR | Status: DC | PRN
  Administered 2016-09-23 (×2): 14 mL/h via EPIDURAL
  Filled 2016-09-23 (×2): qty 100

## 2016-09-23 MED ORDER — OXYTOCIN 40 UNITS IN LACTATED RINGERS INFUSION - SIMPLE MED: 2.5000 [IU]/h | INTRAVENOUS | Status: DC | PRN

## 2016-09-23 NOTE — Anesthesia Pain Management Evaluation Note (Signed)
  [facility] Pain Management Visit Note  Patient: Wanda Norton, 31 y.o., female  "Hello I am a member of the anesthesia team at [facility]. We have an anesthesia team available at all times to provide care throughout the hospital, including epidural management and anesthesia for C-section. I don't know your plan for the delivery whether it a natural birth, water birth, IV sedation, nitrous supplementation, doula or epidural, but we want to meet your pain goals."   1.Was your pain managed to your expectations on prior hospitalizations?   Yes   2.What is your expectation for pain management during this hospitalization?     Epidural  3.How can we help you reach that goal? Epidural, when ready.  Record the patient's initial score and the patient's pain goal.   Pain: 8--pt told to call L&D RN if her pain is more than she wants to feel  Pain Goal: 7 [facility] wants you to be able to say your pain was always managed very well.  Wanda Norton L 09/23/2016

## 2016-09-23 NOTE — H&P (Signed)
LABOR AND DELIVERY ADMISSION HISTORY AND PHYSICAL NOTE  Wanda Norton is a 31 y.o. female [phone] @[redacted]w[redacted]d  pt of [facility] with IUP presenting for IOL for postdates. Patient denies HA, changes in vision, CP, SOB, NVD or LE edema.  She reports positive fetal movement. She denies leakage of fluid or vaginal bleeding.  Prenatal History/Complications:  Past Medical History: Past Medical History:  Diagnosis Date  . Headache   . Medical history non-contributory     Past Surgical History: Past Surgical History:  Procedure Laterality Date  . NO PAST SURGERIES      Obstetrical History: OB History    Gravida Para Term Preterm AB Living   4 2 2  0 1 2   SAB TAB Ectopic Multiple Live Births   1 0 0 0 2      Social History: Social History   Social History  . Marital status: Married    Spouse name: N/A  . Number of children: N/A  . Years of education: N/A   Social History Main Topics  . Smoking status: Never Smoker  . Smokeless tobacco: Never Used  . Alcohol use No  . Drug use: No  . Sexual activity: Yes    Birth control/ protection: None     Comment: Mirena x664months   Other Topics Concern  . None   Social History Narrative  . None    Family History: Family History  Problem Relation Age of Onset  . Alcohol abuse Neg Hx   . Arthritis Neg Hx   . Asthma Neg Hx   . Birth defects Neg Hx   . Cancer Neg Hx   . COPD Neg Hx   . Depression Neg Hx   . Diabetes Neg Hx   . Drug abuse Neg Hx   . Early death Neg Hx   . Hearing loss Neg Hx   . Heart disease Neg Hx   . Hyperlipidemia Neg Hx   . Hypertension Neg Hx   . Kidney disease Neg Hx   . Learning disabilities Neg Hx   . Mental illness Neg Hx   . Mental retardation Neg Hx   . Miscarriages / Stillbirths Neg Hx   . Stroke Neg Hx   . Vision loss Neg Hx   . Varicose Veins Neg Hx     Allergies: No Known Allergies  Prescriptions Prior to Admission  Medication Sig Dispense Refill Last Dose  . acetaminophen (TYLENOL) 325 MG  tablet Take 650 mg by mouth every 6 (six) hours as needed for mild pain, moderate pain or headache.   Past Week at Unknown time  . pantoprazole (PROTONIX) 40 MG tablet Take 1 tablet (40 mg total) by mouth daily. 30 tablet 2   . Prenatal Vit-Fe Fumarate-FA (PRENATAL MULTIVITAMIN) TABS tablet Take 1 tablet by mouth at bedtime.   06/10/2016 at Unknown time     Review of Systems   All systems reviewed and negative except as stated in HPI  Blood pressure 109/65, pulse 83, temperature 98.1 F (36.7 C), temperature source Oral, resp. rate 18, height 5\' 4"  (1.626 m), weight 70.3 kg (155 lb), unknown if currently breastfeeding. General appearance: alert and cooperative Lungs: clear to auscultation bilaterally Heart: regular rate and rhythm Abdomen: soft, non-tender; bowel sounds normal Extremities: No calf swelling or tenderness Presentation: cephalic Fetal monitoring: FHR baseline 160s, mod variabilitty, pos accels, no decels Uterine activity:      Prenatal labs: ABO, Rh: O/Positive/-- (07/31 0000) Antibody: Negative (07/31 0000) Rubella: Immune RPR: Nonreactive (  07/31 0000)  HBsAg: Negative (07/31 0000)  HIV: Non-reactive (07/31 0000)  GBS: Negative (01/11 0000)  1 hr Glucola: normal Genetic screening:  normal Anatomy US: normal  Prenatal Transfer Tool  Maternal Diabetes: No Genetic Screening: Normal Maternal Ultrasounds/Referrals: Normal Fetal Ultrasounds or other Referrals:  None Maternal Substance Abuse:  No Significant Maternal Medications:  None Significant Maternal Lab Results: None  No results found for this or any previous visit (from the past 24 hour(s)).  Patient Active Problem List   Diagnosis Date Noted  . Pregnancy 09/23/2016  . Encounter for insertion of mirena IUD 10/08/2011  . Contraception management 08/07/2011    Assessment: Wanda Norton is a 31 y.o. [id number] at Unknown here for IOL for postdates.   #Labor: IOL for postdates, start with cytotec and  anticipate SVD #Pain: epidural #FWB:  Cat 1 #ID: GBS neg #MOF: breast #MOC: condoms #Circ:  NA  Renne Musca, MD PGY-1 09/23/2016, 1:02 AM  Midwife attestation: I have seen and examined this patient; I agree with above documentation in the resident's note.   Wanda Norton is a 31 y.o. [phone] here for IOL for PD  PE: BP 100/61   Pulse 75   Temp 98.2 F (36.8 C) (Oral)   Resp 18   Ht 5\' 4"  (1.626 m)   Wt 155 lb (70.3 kg)   BMI 26.61 kg/m  Gen: calm comfortable, NAD Resp: normal effort, no distress Abd: gravid  ROS, labs, PMH reviewed  Plan: Admit to LD Labor: Cytotec vaginally Q 4 hours FWB: Category 1 ID: GBS neg  Sharen Counter, CNM  09/23/2016, 7:41 AM

## 2016-09-23 NOTE — Anesthesia Procedure Notes (Signed)
Epidural Patient location during procedure: OB Start time: 09/23/2016 11:50 AM End time: 09/23/2016 11:57 AM  Staffing Anesthesiologist: Leilani AbleHATCHETT, Edmon Magid Performed: anesthesiologist   Preanesthetic Checklist Completed: patient identified, surgical consent, pre-op evaluation, timeout performed, IV checked, risks and benefits discussed and monitors and equipment checked  Epidural Patient position: sitting Prep: site prepped and draped and DuraPrep Patient monitoring: continuous pulse ox and blood pressure Approach: midline Location: L3-L4 Injection technique: LOR air  Needle:  Needle type: Tuohy  Needle gauge: 17 G Needle length: 9 cm and 9 Needle insertion depth: 4 cm Catheter type: closed end flexible Catheter size: 19 Gauge Catheter at skin depth: 9 cm Test dose: negative and Other  Assessment Sensory level: T9 Events: blood not aspirated, injection not painful, no injection resistance, negative IV test and no paresthesia  Additional Notes Reason for block:procedure for pain

## 2016-09-23 NOTE — Anesthesia Preprocedure Evaluation (Signed)
Anesthesia Evaluation  Patient identified by MRN, date of birth, ID band Patient awake    Reviewed: Allergy & Precautions, H&P , NPO status , Patient's Chart, lab work & pertinent test results  Airway Mallampati: II  TM Distance: >3 FB Neck ROM: full    Dental no notable dental hx.    Pulmonary neg pulmonary ROS,    Pulmonary exam normal breath sounds clear to auscultation       Cardiovascular negative cardio ROS Normal cardiovascular exam     Neuro/Psych negative neurological ROS  negative psych ROS   GI/Hepatic negative GI ROS, Neg liver ROS,   Endo/Other  negative endocrine ROS  Renal/GU negative Renal ROS     Musculoskeletal   Abdominal Normal abdominal exam  (+)   Peds  Hematology negative hematology ROS (+)   Anesthesia Other Findings   Reproductive/Obstetrics (+) Pregnancy                             Anesthesia Physical Anesthesia Plan  ASA: II  Anesthesia Plan: Epidural   Post-op Pain Management:    Induction:   Airway Management Planned:   Additional Equipment:   Intra-op Plan:   Post-operative Plan:   Informed Consent: I have reviewed the patients History and Physical, chart, labs and discussed the procedure including the risks, benefits and alternatives for the proposed anesthesia with the patient or authorized representative who has indicated his/her understanding and acceptance.     Plan Discussed with:   Anesthesia Plan Comments:         Anesthesia Quick Evaluation

## 2016-09-23 NOTE — Progress Notes (Signed)
Patient ID: Wanda Norton, female   DOB: [birth year], 31 y.o.   MRN: [id number]  S: Patient seen & examined for progress of labor. Patient comfortable with epidural.    O:  Vitals:   09/23/16 1540 09/23/16 1550 09/23/16 1601 09/23/16 1620  BP:   99/70   Pulse:   (!) 124   Resp: 18 20 18 20   Temp:      TempSrc:      SpO2:      Weight:      Height:        Dilation: 10 Dilation Complete Date: 09/23/16 Dilation Complete Time: 1443 Effacement (%): 100 Cervical Position: Middle Station: +1, +2 Presentation: Vertex Exam by:: Dr. Omer JackMumaw  AROM performed, light to moderate meconium stained fluid. Patient and baby tolerated procedure well.   FHT: 140 bpm, mod var, +accels, no decels TOCO: q372min   A/P: AROM performed Attempting pushing Continue expectant management Anticipate SVD

## 2016-09-24 DIAGNOSIS — R1912 Hyperactive bowel sounds: Secondary | ICD-10-CM

## 2016-09-24 LAB — RPR: RPR: NONREACTIVE

## 2016-09-24 NOTE — Anesthesia Postprocedure Evaluation (Signed)
Anesthesia Post Note  Patient: Wanda Norton  Procedure(s) Performed: * No procedures listed *  Patient location during evaluation: Mother Baby Anesthesia Type: Epidural Level of consciousness: awake, awake and alert, oriented and patient cooperative Pain management: pain level controlled Vital Signs Assessment: post-procedure vital signs reviewed and stable Respiratory status: spontaneous breathing, nonlabored ventilation and respiratory function stable Cardiovascular status: stable Postop Assessment: no headache, no backache, no signs of nausea or vomiting and patient able to bend at knees Anesthetic complications: no        Last Vitals:  Vitals:   09/23/16 2344 09/24/16 0334  BP: (!) 105/52 103/60  Pulse: 95 79  Resp: 18 18  Temp: 37.4 C 37.3 C    Last Pain:  Vitals:   09/24/16 0542  TempSrc:   PainSc: 4    Pain Goal: Patients Stated Pain Goal: 0 (09/23/16 2224)               Perrin Gens L

## 2016-09-24 NOTE — Progress Notes (Signed)
POSTPARTUM PROGRESS NOTE  Post Partum Day 1 Subjective:  Wanda Norton is a 31 y.o. [id number] 948w1d s/p SVD.  No acute events overnight.  Pt denies problems with ambulating, voiding or po intake.  She denies nausea or vomiting.  Pain is well controlled.  She has not had flatus. She has not had bowel movement.  Lochia Minimal.   Objective: Blood pressure 103/60, pulse 79, temperature 99.1 F (37.3 C), temperature source Oral, resp. rate 18, height 5\' 4"  (1.626 m), weight 70.3 kg (155 lb), SpO2 97 %, unknown if currently breastfeeding.  Physical Exam:  General: alert, cooperative and no distress Lochia:normal flow Chest: CTAB Heart: RRR no m/r/g Abdomen: +BS, soft, nontender,  Uterine Fundus: firm DVT Evaluation: No calf swelling or tenderness Extremities: no edema   Recent Labs  09/23/16 0055  HGB 12.1  HCT 36.1    Assessment/Plan:  ASSESSMENT: Wanda Colonelbeba Lipson is a 31 y.o. [id number] 748w1d s/p SVD. Feeling well with no concerns. Late delivery last night. Planning to stay the night.   Plan for discharge tomorrow   LOS: 1 day   Renne Muscaaniel L Warden, MD PGY-1 [facility], [facility]  09/24/2016, 9:22 AM   OB FELLOW POSTPARTUM PROGRESS NOTE ATTESTATION  I have seen and examined this patient and agree with above documentation in the resident's note.   Ernestina PennaNicholas Shara Hartis, MD 7:47 PM

## 2016-09-24 NOTE — Lactation Note (Signed)
This note was copied from a baby's chart. Lactation Consultation Note  Patient Name: Wanda Norton ZOXWR'UToday's Date: 09/24/2016 Reason for consult: Initial assessment Mom experienced BF and reports this baby nursing well. Lots of colostrum with demonstration of hand expression. Encouraged to continue to BF with feeding ques, 8-12 times or more in 24 hours. Lactation brochure left for review, advised of OP services and support group. Encouraged to call for questions/concerns or assist as needed.   Maternal Data Has patient been taught Hand Expression?: Yes Does the patient have breastfeeding experience prior to this delivery?: Yes  Feeding Feeding Type: Breast Fed Length of feed: 10 min  LATCH Score/Interventions                      Lactation Tools Discussed/Used WIC Program: Yes   Consult Status Consult Status: Follow-up Date: 09/25/16 Follow-up type: In-patient    Alfred LevinsGranger, Seyed Heffley Ann 09/24/2016, 1:39 PM

## 2016-09-24 NOTE — Progress Notes (Signed)
CM / UR chart review completed.  

## 2016-09-25 MED ORDER — IBUPROFEN 600 MG PO TABS: 600.0000 mg | ORAL_TABLET | Freq: Four times a day (QID) | ORAL | 0 refills | Status: DC

## 2016-09-25 MED ORDER — DOCUSATE SODIUM 100 MG PO CAPS: 100.0000 mg | ORAL_CAPSULE | Freq: Two times a day (BID) | ORAL | 0 refills | Status: DC

## 2016-09-25 NOTE — Discharge Summary (Signed)
OB Discharge Summary     Patient Name: Wanda Norton DOB: [birth year] MRN: [id number]  Date of admission: 09/23/2016 Delivering MD: Jen MowMUMAW, [facility]   Date of discharge: 09/25/2016  Admitting diagnosis: induction Intrauterine pregnancy: 7945w1d     Secondary diagnosis:  Active Problems:   Pregnancy  Additional problems: None     Discharge diagnosis: Term Pregnancy Delivered                                                                                                Post partum procedures:None  Augmentation: AROM and Cytotec  Complications: None  Hospital course:  Induction of Labor With Vaginal Delivery   31 y.o. yo [phone] at 1945w1d was admitted to the hospital 09/23/2016 for induction of labor.  Indication for induction: Postdates.  Patient had an uncomplicated labor course as follows: Membrane Rupture Time/Date: 4:20 PM ,09/23/2016   Intrapartum Procedures: Episiotomy: None [1]                                         Lacerations:  Vaginal [6]  Patient had delivery of a Viable infant.  Information for the patient's newborn:  Tommi RumpsGirmay, Girl Nakiesha [[id number]][[id number]]  Delivery Method: Vaginal, Spontaneous Delivery (Filed from Delivery Summary)   09/23/2016  Details of delivery can be found in separate delivery note.  Patient had a routine postpartum course. Patient is discharged home 09/25/16.  Physical exam  Vitals:   09/24/16 0334 09/24/16 0930 09/24/16 1803 09/25/16 0614  BP: 103/60 113/64 97/63 94/61   Pulse: 79 98 77 88  Resp: 18 18 18 18   Temp: 99.1 F (37.3 C) 98 F (36.7 C) 98 F (36.7 C) 98.2 F (36.8 C)  TempSrc: Oral Oral Oral Oral  SpO2: 97%     Weight:      Height:       General: alert, cooperative and no distress Lochia: appropriate Uterine Fundus: firm Incision: N/A DVT Evaluation: No evidence of DVT seen on physical exam. Negative Homan's sign. No cords or calf tenderness. Labs: Lab Results  Component Value Date   WBC 7.5 09/23/2016   HGB 12.1  09/23/2016   HCT 36.1 09/23/2016   MCV 89.1 09/23/2016   PLT 217 09/23/2016   CMP Latest Ref Rng & Units 06/30/2014  Glucose 70 - 99 mg/dL 95  BUN 6 - 23 mg/dL 6  Creatinine 1.300.50 - 8.651.10 mg/dL 7.840.67  Sodium 696137 - 295147 mEq/L 140  Potassium 3.7 - 5.3 mEq/L 3.8  Chloride 96 - 112 mEq/L 104  CO2 19 - 32 mEq/L 23  Calcium 8.4 - 10.5 mg/dL 9.3  Total Protein 6.0 - 8.3 g/dL 7.9  Total Bilirubin 0.3 - 1.2 mg/dL 0.4  Alkaline Phos 39 - 117 U/L 62  AST 0 - 37 U/L 24  ALT 0 - 35 U/L 17    Discharge instruction: per After Visit Summary and "Baby and Me Booklet".  After visit meds:  Allergies as of 09/25/2016   No Known Allergies  Medication List    TAKE these medications   docusate sodium 100 MG capsule Commonly known as:  COLACE Take 1 capsule (100 mg total) by mouth 2 (two) times daily.   ibuprofen 600 MG tablet Commonly known as:  ADVIL,MOTRIN Take 1 tablet (600 mg total) by mouth every 6 (six) hours.   prenatal multivitamin Tabs tablet Take 1 tablet by mouth at bedtime.       Diet: routine diet  Activity: Advance as tolerated. Pelvic rest for 6 weeks.   Outpatient follow up:6 weeks Follow up Appt:No future appointments. Follow up Visit:No Follow-up on file.  Postpartum contraception: Nexplanon  Newborn Data: Live born female  Birth Weight: 8 lb 8.9 oz (3881 g) APGAR: 8, 9  Baby Feeding: Breast Disposition:home with mother   09/25/2016 Jen Mow, DO  OB Fellow

## 2016-09-25 NOTE — Discharge Instructions (Signed)

## 2016-10-26 IMAGING — CR DG CHEST 2V
2 series · 2 of 2 positions shown · non-contrast
Comparison: CT and radiographs 12/15/2011.

CLINICAL DATA: Chest pain with shortness of breath. Vomiting today.
Initial encounter.

EXAM:
CHEST  2 VIEW

[w chest pa]
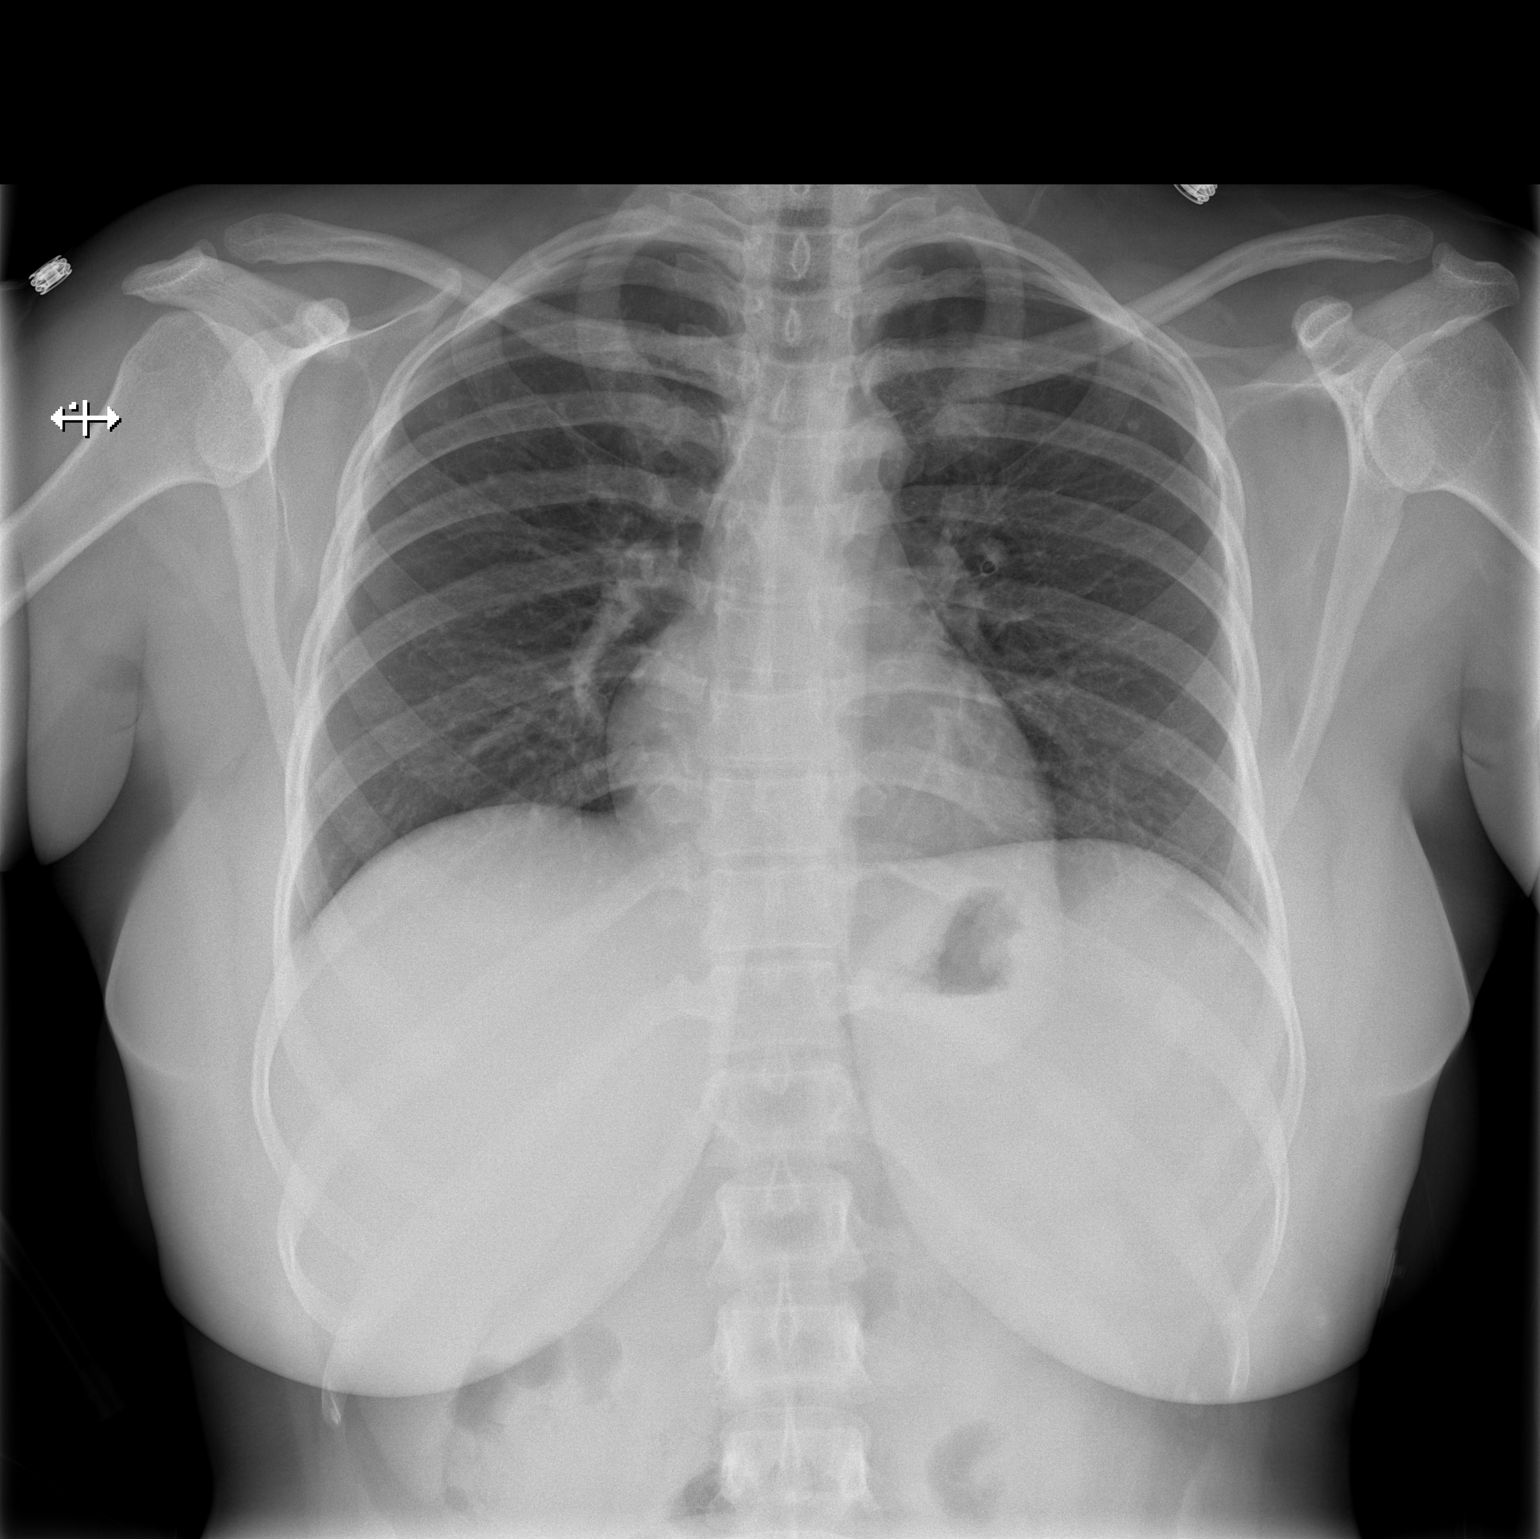

[w chest lat]
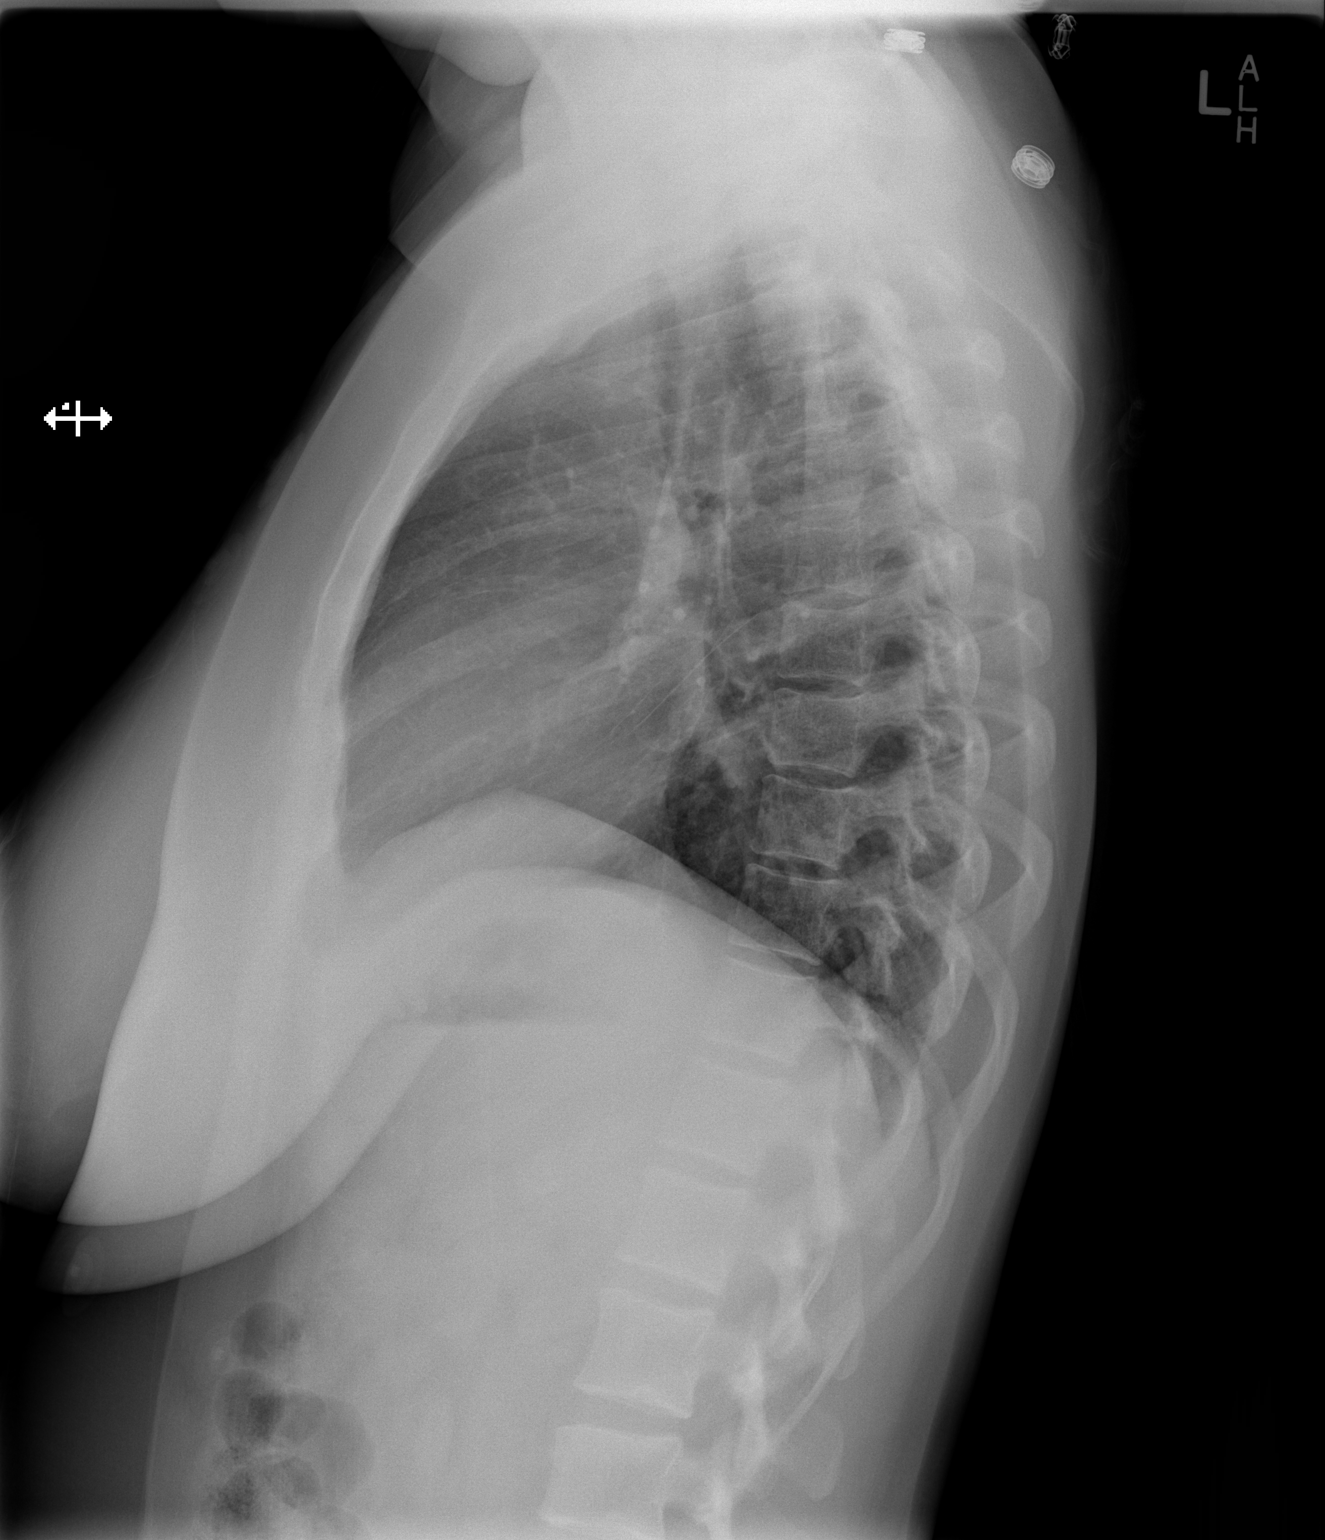

[2 of 2 positions shown; findings below may reference images not displayed]

FINDINGS: There are low lung volumes with mild vascular crowding at both lung
bases. No confluent airspace opacity, edema or pleural effusion is
present. The heart size and mediastinal contours are stable. The
bones appear unremarkable.
IMPRESSION: No active cardiopulmonary process.

## 2016-12-31 ENCOUNTER — Other Ambulatory Visit: Payer: Self-pay | Admitting: Family Medicine

## 2017-07-13 ENCOUNTER — Other Ambulatory Visit: Payer: Self-pay | Admitting: Nurse Practitioner

## 2017-11-03 ENCOUNTER — Inpatient Hospital Stay (HOSPITAL_COMMUNITY)
Admission: AD | Admit: 2017-11-03 | Discharge: 2017-11-03 | Disposition: A | Discharge status: Home / Self Care | Payer: 59 | Source: Ambulatory Visit | Attending: Family Medicine | Admitting: Family Medicine

## 2017-11-03 ENCOUNTER — Encounter (HOSPITAL_COMMUNITY): Payer: Self-pay

## 2017-11-03 DIAGNOSIS — O9123 Nonpurulent mastitis associated with lactation: Secondary | ICD-10-CM

## 2017-11-03 LAB — POCT PREGNANCY, URINE: PREG TEST UR: NEGATIVE

## 2017-11-03 MED ORDER — DICLOXACILLIN SODIUM 500 MG PO CAPS: 500.0000 mg | ORAL_CAPSULE | Freq: Four times a day (QID) | ORAL | 0 refills | Status: DC

## 2017-11-03 NOTE — Discharge Instructions (Signed)
Mastitis °Mastitis is redness, soreness, and puffiness (inflammation) in an area of the breast. It is often caused by an infection that occurs when bacteria enter the skin. The infection is often helped by antibiotic medicine. °Follow these instructions at home: °· Only take medicines as told by your doctor. °· If your doctor prescribed an antibiotic medicine, take it as told. Finish it even if you start to feel better. °· Do not wear a tight or underwire bra. Wear a soft support bra. °· Drink more fluids, especially if you have a fever. °· If you are breastfeeding: °? Keep emptying the breast. Your doctor can tell you if the milk is safe. Use a breast pump if you are told to stop nursing. °? Keep your nipples clean and dry. °? Empty the first breast before going to the other breast. Use a breast pump if your baby is not emptying your breast. °? If you go back to work, pump your breasts while at work. °? Avoid letting your breasts get overly filled with milk (engorged). °Contact a doctor if: °· You have pus-like fluid leaking from your breast. °· Your symptoms do not get better within 2 days. °Get help right away if: °· Your pain and puffiness are getting worse. °· Your pain is not helped by medicine. °· You have a red line going from your breast toward your armpit. °· You have a fever or lasting symptoms for more than 2-3 days. °· You have a fever and your symptoms suddenly get worse. °This information is not intended to replace advice given to you by your health care provider. Make sure you discuss any questions you have with your health care provider. °Document Released: 07/16/2009 Document Revised: 01/03/2016 Document Reviewed: 02/25/2013 °Elsevier Interactive Patient Education © 2017 Elsevier Inc. ° °

## 2017-11-03 NOTE — MAU Provider Note (Signed)
History     CSN: [id number]  Arrival date and time: 11/03/17 1524   First Provider Initiated Contact with Patient 11/03/17 1902      Chief Complaint  Patient presents with  . Breast Pain   32 y.o. Female here with left breast pain since yesterday. She is breastfeeding her baby q3-4 hrs. No change in feeding schedule. She thinks she had a fever yesterday but did not check temp. No nipple pain. No skin changes.   Past Medical History:  Diagnosis Date  . Headache     Past Surgical History:  Procedure Laterality Date  . NO PAST SURGERIES      Family History  Problem Relation Age of Onset  . Alcohol abuse Neg Hx   . Arthritis Neg Hx   . Asthma Neg Hx   . Birth defects Neg Hx   . Cancer Neg Hx   . COPD Neg Hx   . Depression Neg Hx   . Diabetes Neg Hx   . Drug abuse Neg Hx   . Early death Neg Hx   . Hearing loss Neg Hx   . Heart disease Neg Hx   . Hyperlipidemia Neg Hx   . Hypertension Neg Hx   . Kidney disease Neg Hx   . Learning disabilities Neg Hx   . Mental illness Neg Hx   . Mental retardation Neg Hx   . Miscarriages / Stillbirths Neg Hx   . Stroke Neg Hx   . Vision loss Neg Hx   . Varicose Veins Neg Hx     Social History   Tobacco Use  . Smoking status: Never Smoker  . Smokeless tobacco: Never Used  Substance Use Topics  . Alcohol use: No  . Drug use: No    Allergies: No Known Allergies  Medications Prior to Admission  Medication Sig Dispense Refill Last Dose  . docusate sodium (COLACE) 100 MG capsule Take 1 capsule (100 mg total) by mouth 2 (two) times daily. 30 capsule 0   . ibuprofen (ADVIL,MOTRIN) 600 MG tablet Take 1 tablet (600 mg total) by mouth every 6 (six) hours. 30 tablet 0   . Prenatal Vit-Fe Fumarate-FA (PRENATAL MULTIVITAMIN) TABS tablet Take 1 tablet by mouth at bedtime.   Past Week at Unknown time    Review of Systems  Constitutional: Positive for chills and fever.  Skin: Negative for color change and rash.   Physical Exam    Blood pressure 111/69, pulse 91, temperature 98.3 F (36.8 C), temperature source Oral, resp. rate 16, weight 155 lb (70.3 kg), last menstrual period 10/25/2017, unknown if currently breastfeeding.  Physical Exam  Nursing note and vitals reviewed. Constitutional: She is oriented to person, place, and time. She appears well-developed and well-nourished. No distress.  HENT:  Head: Normocephalic and atraumatic.  Neck: Normal range of motion.  Respiratory: Effort normal. No respiratory distress. Right breast exhibits no inverted nipple, no mass, no nipple discharge, no skin change and no tenderness. Left breast exhibits tenderness. Left breast exhibits no inverted nipple, no mass, no nipple discharge and no skin change.    Musculoskeletal: Normal range of motion.  Neurological: She is alert and oriented to person, place, and time.  Skin: Skin is warm and dry.  Psychiatric: She has a normal mood and affect.   Results for orders placed or performed during the hospital encounter of 11/03/17 (from the past 24 hour(s))  Pregnancy, urine POC     Status: None   Collection Time: 11/03/17  4:08 PM  Result Value Ref Range   Preg Test, Ur NEGATIVE NEGATIVE   MAU Course  Procedures  MDM Labs ordered and reviewed. Will treat for mastitis. Recommend pumping or feeding q3-4 hrs. Call clinic if not improving in 2 days. Stable for discharge home.  Assessment and Plan   1. Nonpurulent mastitis associated with lactation    Discharge home Follow up in [facility] as needed Rx Dicloxacillin Ibuprofen prn  Allergies as of 11/03/2017   No Known Allergies     Medication List    TAKE these medications   dicloxacillin 500 MG capsule Commonly known as:  DYNAPEN Take 1 capsule (500 mg total) by mouth 4 (four) times daily.   docusate sodium 100 MG capsule Commonly known as:  COLACE Take 1 capsule (100 mg total) by mouth 2 (two) times daily.   ibuprofen 600 MG tablet Commonly known as:   ADVIL,MOTRIN Take 1 tablet (600 mg total) by mouth every 6 (six) hours.   prenatal multivitamin Tabs tablet Take 1 tablet by mouth at bedtime.      Donette LarryMelanie Joan Herschberger, CNM 11/03/2017, 7:11 PM

## 2017-11-03 NOTE — Congregational Nurse Program (Signed)
Congregational Nurse Program Note  Date of Encounter: 11/03/2017  Past Medical History: Past Medical History:  Diagnosis Date  . Headache   . Medical history non-contributory     Encounter Details: CNP Questionnaire - 11/03/17 1000      Questionnaire   Patient Status  Refugee    Race  African    Location Patient Served At  [employer]    Insurance  Private Insurance    Uninsured  Not Applicable    Food  No food insecurities    Housing/Utilities  Yes, have permanent housing    Transportation  No transportation needs    Interpersonal Safety  Yes, feel physically and emotionally safe where you currently live    Medication  No medication insecurities    Medical Provider  No    Referrals  Other;Primary Care Provider/Clinic    ED Visit Averted  Not Applicable    Life-Saving Intervention Made  Not Applicable      Office visit at [employer] for this English speaking lady with concern about her left breast. Describes pain in left breast with radiation to left arm, axillary and shoulder area since about last 48 hours. States "I had a fever and chills" and took Motrin.  Previous history of problem with right breast with previous breast feeding. Currently breast feeding a second child, age [age]. [facility]. Currently not seeing a PCP routinely. Pain especially noted in left breast area and tender upon palpation of glandular areas and at a level 6. No nipple discharge noted or reported. [facility] contacted and referred patient to ER entrance for assessment immediately. Return to [facility] nurse office to discuss referral to a PCP and prn. Ferol LuzMarietta Son Barkan, RN/CN

## 2017-11-03 NOTE — MAU Note (Signed)
Pt C/O L breast pain since yesterday, has been breastfeeding for the past year.  Pt thinks she had a fever last night, does not have thermometer, has HA.  Saw congregational nurse today who referred her here.

## 2018-01-25 IMAGING — US US OB TRANSVAGINAL
1 series · 15 of 28 positions shown · non-contrast
Comparison: None.

CLINICAL DATA: Positive urine pregnancy test. Patient with a Mirena
IUD. Beta HCG level, [DATE]. Patient 5 weeks and 4 days pregnant
based on the last menstrual period.

EXAM:
OBSTETRIC <14 WK US AND TRANSVAGINAL OB US
TECHNIQUE: Both transabdominal and transvaginal ultrasound examinations were
performed for complete evaluation of the gestation as well as the
maternal uterus, adnexal regions, and pelvic cul-de-sac.
Transvaginal technique was performed to assess early pregnancy.

[Series 1: us ob transvaginal · 36 acquisitions, 15 frames shown]
[im 1/36]
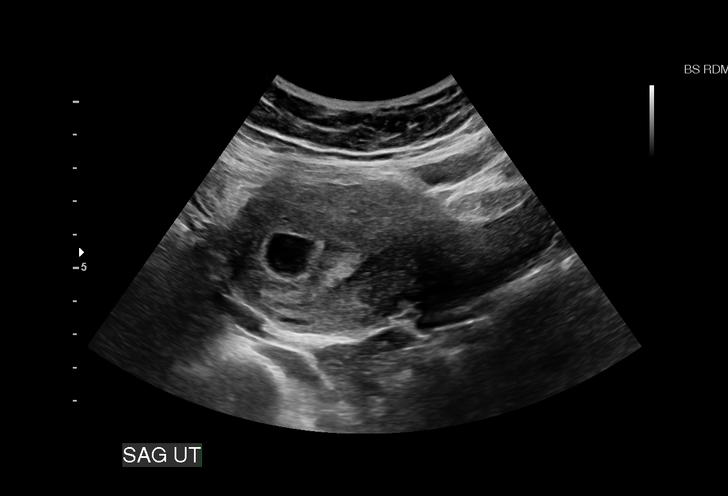
[im 3/36]
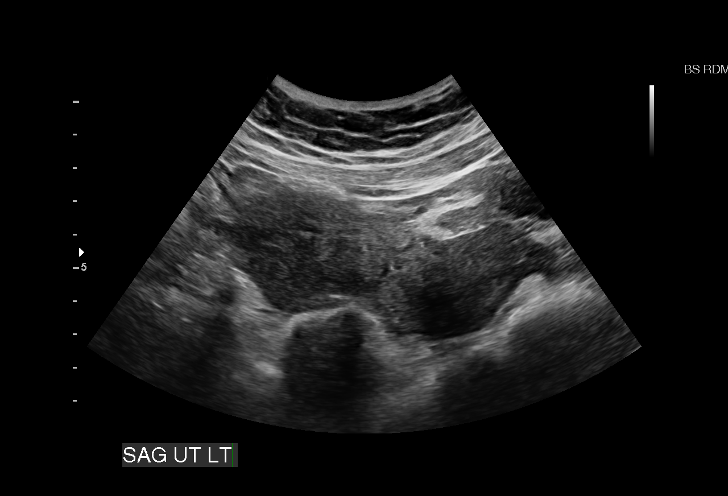
[im 6/36]
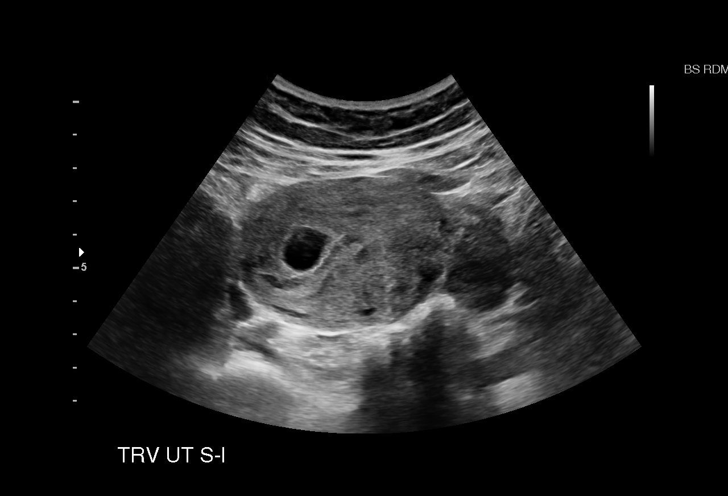
[im 8/36]
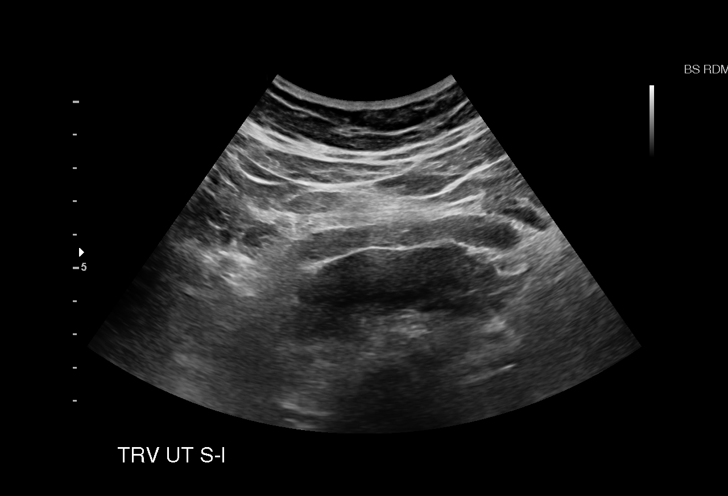
[im 11/36]
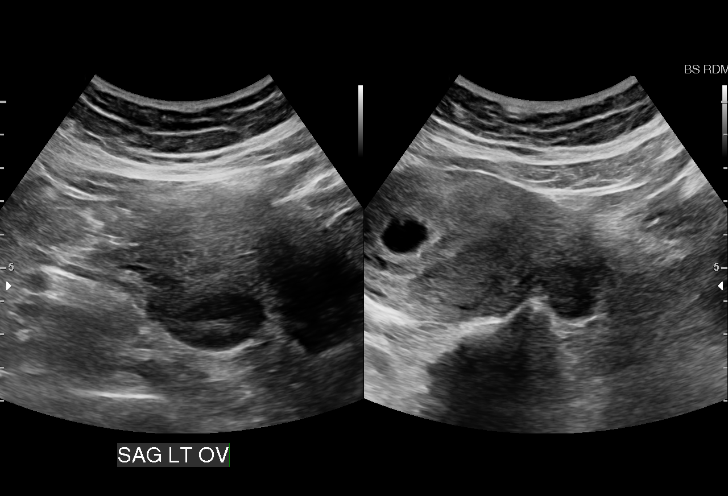
[im 13/36]
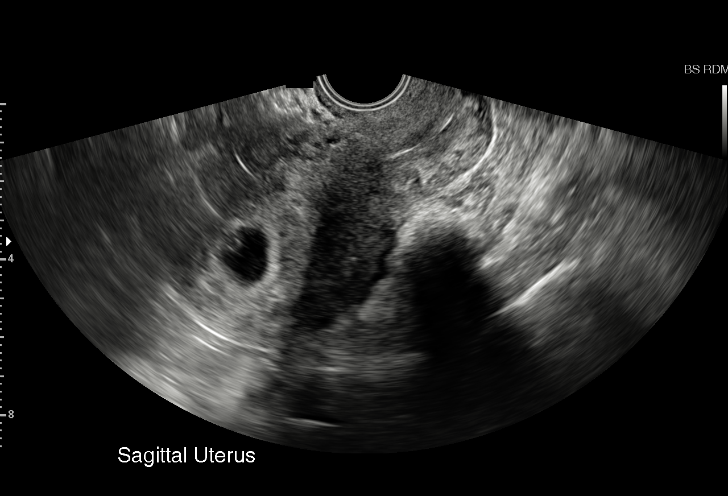
[im 16/36]
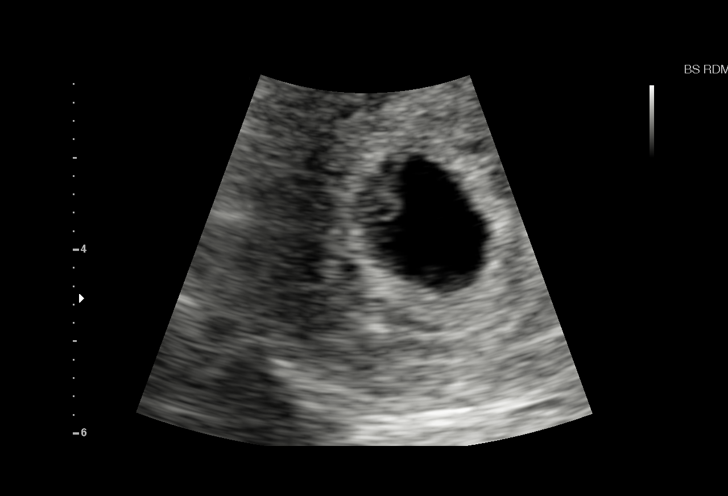
[im 19/36]
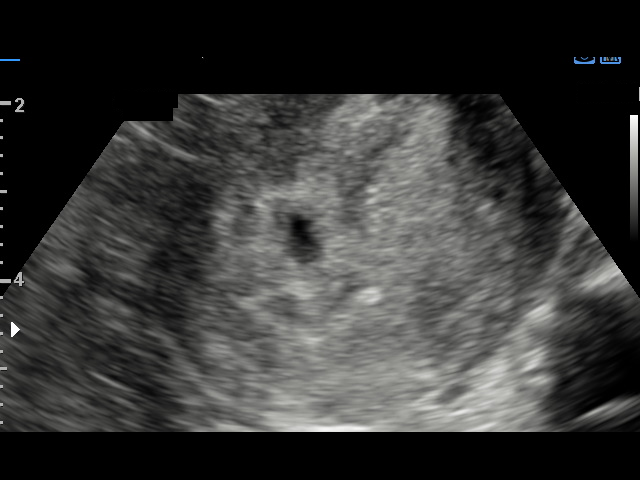
[im 20/36]
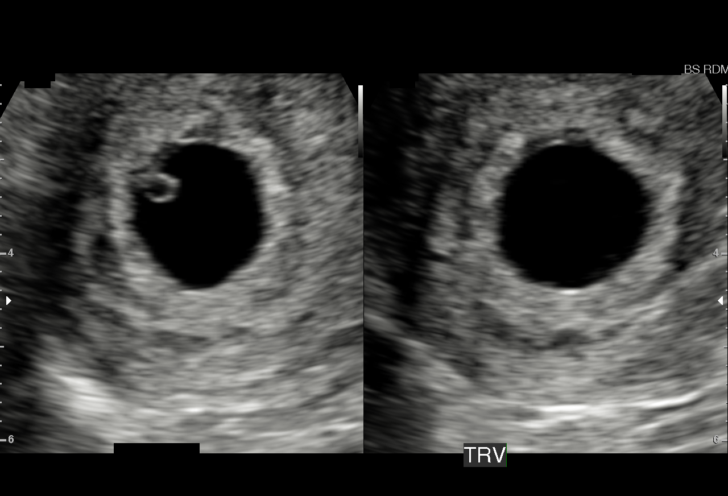
[im 23/36]
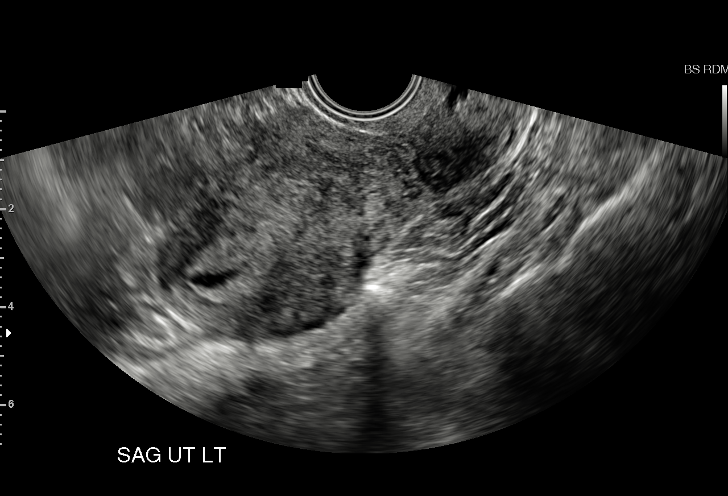
[im 25/36]
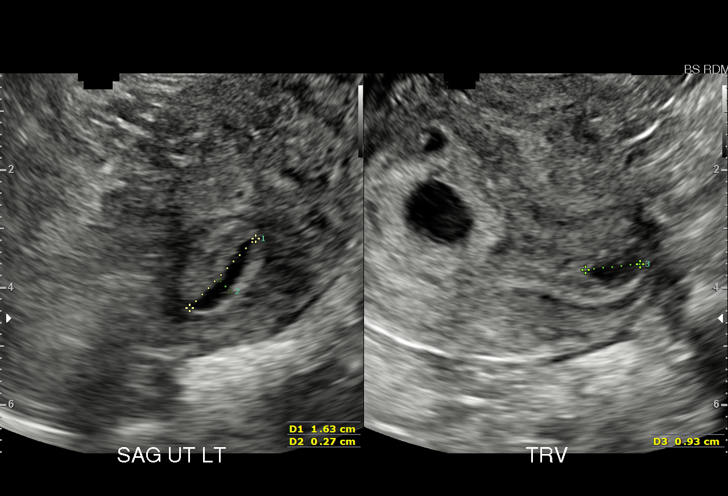
[im 28/36]
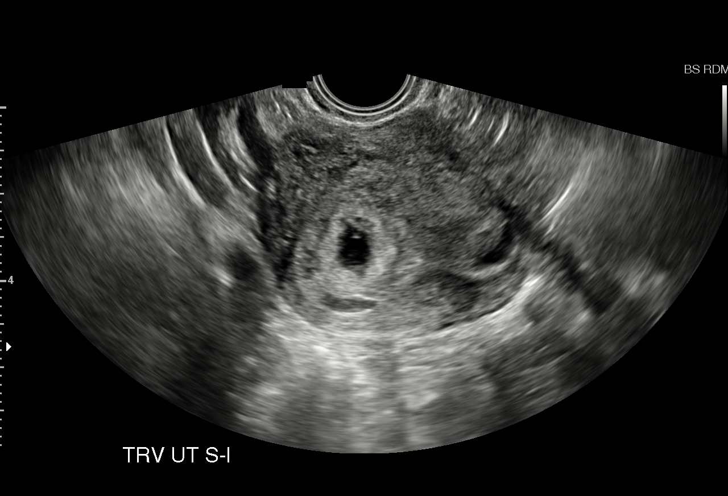
[im 30/36]
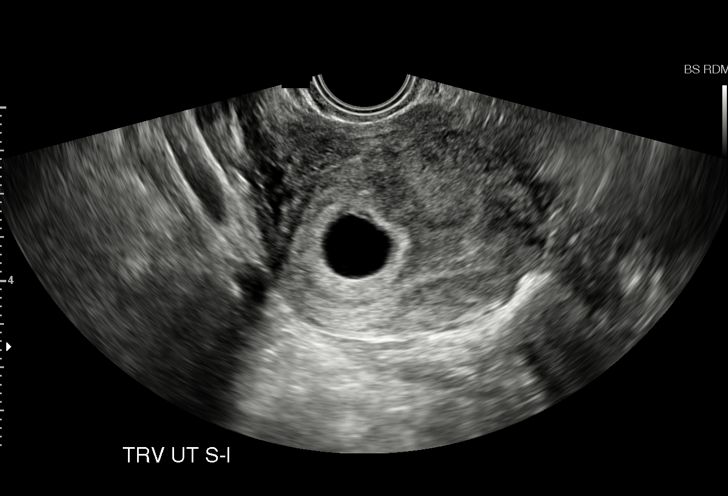
[im 33/36]
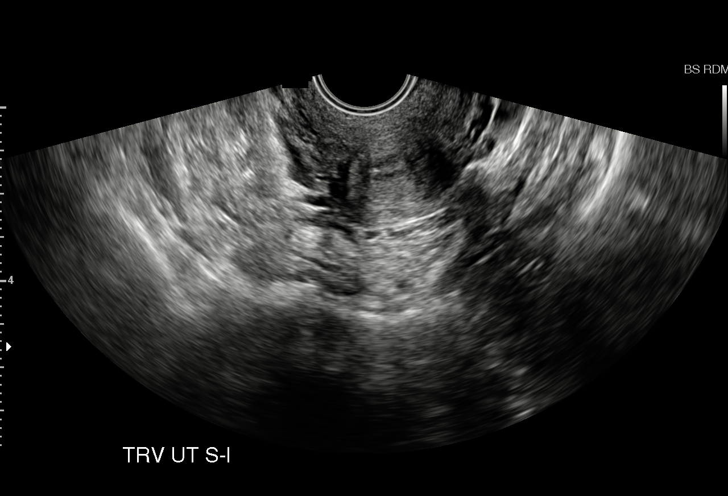
[im 36/36]
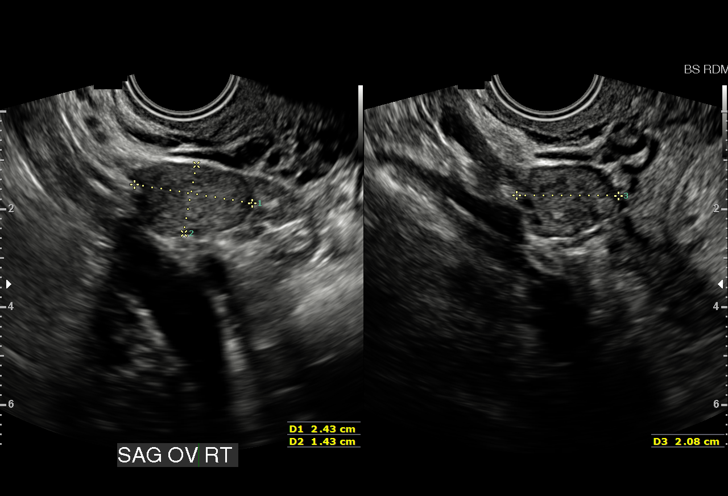

[15 of 28 positions shown; findings below may reference images not displayed]

FINDINGS: Intrauterine gestational sac: Visualized/normal in shape.

Yolk sac:  Yes

Embryo:  No

MSD: 15.2  mm   6 w   2  d

Subchorionic hemorrhage: Questionable small subchronic hemorrhage
along the left aspect of the uterus measuring 16 x 3 x 9 mm.

Maternal uterus/adnexae: No uterine masses. Cervix is closed. Normal
ovaries. No adnexal masses or free fluid.

No IUD visualized.
IMPRESSION: 1. Gestational sac within the uterus with a yolk sac. Probable early
intrauterine gestational sac, but fetal pole, or cardiac activity
yet visualized. Recommend follow-up quantitative B-HCG levels and
follow-up US in 14 days to confirm and assess viability. This
recommendation follows SRU consensus guidelines: Diagnostic Criteria
for Nonviable Pregnancy Early in the First Trimester. N Engl J Med
4220; [DATE].
2. Possible small subchronic hemorrhage.
3. No other evidence of a pregnancy complication.
4. Normal ovaries and adnexa.
5. No IUD visualized.

## 2020-06-25 DIAGNOSIS — Z3483 Encounter for supervision of other normal pregnancy, third trimester: Secondary | ICD-10-CM | POA: Diagnosis not present

## 2020-06-25 LAB — HM PAP SMEAR
HM Pap smear: NORMAL
HPV, high-risk: NEGATIVE

## 2020-06-25 LAB — OB RESULTS CONSOLE RUBELLA ANTIBODY, IGM: Rubella: IMMUNE

## 2020-06-25 LAB — OB RESULTS CONSOLE HEPATITIS B SURFACE ANTIGEN: Hepatitis B Surface Ag: NEGATIVE

## 2020-06-25 LAB — HEPATITIS C ANTIBODY: HCV Ab: NEGATIVE

## 2020-08-11 DIAGNOSIS — Z3483 Encounter for supervision of other normal pregnancy, third trimester: Secondary | ICD-10-CM | POA: Diagnosis not present

## 2020-08-11 NOTE — L&D Delivery Note (Signed)
Delivery Note Wanda Norton is a 35 y.o. [id number] at [redacted]w[redacted]d admitted for elective IOL.   GBS Status: negative Negative/-- (04/25 0000) Maximum Maternal Temperature: 99.0  Labor course: Initial SVE: FT/50/-3. Augmentation with: AROM, Pitocin, Cytotec and IP Foley. She then progressed to complete.  ROM: 3h 71m with meconium stained fluid  Birth: At 0120 a viable female was delivered via spontaneous vaginal delivery (Presentation: cephalic; ROA). Nuchal cord present x1, baby somersaulted and delivered through without difficulty. Shoulders and body delivered in usual fashion. Infant placed directly on mom's abdomen for bonding/skin-to-skin, baby dried and stimulated. Cord clamped x 2 after 1 minute and cut by FOB. Cord blood collected. The placenta separated spontaneously and delivered via gentle cord traction.  Pitocin infused rapidly IV per protocol. Fundus firm with massage.  Placenta inspected and appears to be intact with a 3 VC.  Placenta/Cord with the following complications: n/a. Cord pH: n/a Sponge and instrument count were correct x2.  Intrapartum complications:  None Anesthesia:  none Episiotomy: none Lacerations:  none Suture Repair: n/a EBL (mL):  Infant: APGAR (1 MIN): 8  APGAR (5 MINS): 9  Infant weight: 4346g  Mom to postpartum.  Baby to Couplet care / Skin to Skin. Placenta to L&D   Plans to Breast and bottlefeed Contraception: vasectomy Circumcision: N/A  Note sent to [facility]: N/A, [facility] pt for pp visit.   Brand Males, MSN, CNM 01/01/2021 1:40 AM

## 2020-10-09 DIAGNOSIS — Z3483 Encounter for supervision of other normal pregnancy, third trimester: Secondary | ICD-10-CM | POA: Diagnosis not present

## 2020-10-11 DIAGNOSIS — Z3483 Encounter for supervision of other normal pregnancy, third trimester: Secondary | ICD-10-CM | POA: Diagnosis not present

## 2020-10-11 LAB — OB RESULTS CONSOLE HIV ANTIBODY (ROUTINE TESTING): HIV: NONREACTIVE

## 2020-10-11 LAB — OB RESULTS CONSOLE RPR: RPR: NONREACTIVE

## 2020-10-15 DIAGNOSIS — Z8759 Personal history of other complications of pregnancy, childbirth and the puerperium: Secondary | ICD-10-CM | POA: Diagnosis not present

## 2020-10-15 DIAGNOSIS — R823 Hemoglobinuria: Secondary | ICD-10-CM | POA: Diagnosis not present

## 2020-10-15 DIAGNOSIS — Z3483 Encounter for supervision of other normal pregnancy, third trimester: Secondary | ICD-10-CM | POA: Diagnosis not present

## 2020-10-15 DIAGNOSIS — O99810 Abnormal glucose complicating pregnancy: Secondary | ICD-10-CM | POA: Diagnosis not present

## 2020-10-15 DIAGNOSIS — O2602 Excessive weight gain in pregnancy, second trimester: Secondary | ICD-10-CM | POA: Diagnosis not present

## 2020-10-15 DIAGNOSIS — R809 Proteinuria, unspecified: Secondary | ICD-10-CM | POA: Diagnosis not present

## 2020-10-15 DIAGNOSIS — O09519 Supervision of elderly primigravida, unspecified trimester: Secondary | ICD-10-CM | POA: Diagnosis not present

## 2020-10-15 DIAGNOSIS — R824 Acetonuria: Secondary | ICD-10-CM | POA: Diagnosis not present

## 2020-10-15 DIAGNOSIS — Z8616 Personal history of COVID-19: Secondary | ICD-10-CM | POA: Diagnosis not present

## 2020-10-15 DIAGNOSIS — O99013 Anemia complicating pregnancy, third trimester: Secondary | ICD-10-CM | POA: Diagnosis not present

## 2020-12-03 DIAGNOSIS — Z3483 Encounter for supervision of other normal pregnancy, third trimester: Secondary | ICD-10-CM | POA: Diagnosis not present

## 2020-12-03 LAB — OB RESULTS CONSOLE GC/CHLAMYDIA
Chlamydia: NEGATIVE
Gonorrhea: NEGATIVE

## 2020-12-03 LAB — OB RESULTS CONSOLE GBS: GBS: NEGATIVE

## 2020-12-18 ENCOUNTER — Telehealth (HOSPITAL_COMMUNITY): Payer: Self-pay | Admitting: *Deleted

## 2020-12-18 ENCOUNTER — Encounter (HOSPITAL_COMMUNITY): Payer: Self-pay | Admitting: *Deleted

## 2020-12-18 NOTE — Telephone Encounter (Signed)
Preadmission screen Pt states she has a positive covid test today 12/18/2020.  States she spoke with a doctor today and told him the coivd test was positive.

## 2020-12-24 ENCOUNTER — Encounter (HOSPITAL_COMMUNITY): Payer: Self-pay | Admitting: *Deleted

## 2020-12-24 ENCOUNTER — Telehealth (HOSPITAL_COMMUNITY): Payer: Self-pay | Admitting: *Deleted

## 2020-12-24 NOTE — Telephone Encounter (Signed)
Preadmission screen  

## 2020-12-26 ENCOUNTER — Other Ambulatory Visit: Payer: Self-pay | Admitting: Advanced Practice Midwife

## 2020-12-26 ENCOUNTER — Other Ambulatory Visit (HOSPITAL_COMMUNITY): Payer: Self-pay

## 2020-12-28 ENCOUNTER — Other Ambulatory Visit (HOSPITAL_COMMUNITY): Payer: BLUE CROSS/BLUE SHIELD

## 2020-12-28 ENCOUNTER — Inpatient Hospital Stay (HOSPITAL_COMMUNITY): Payer: BLUE CROSS/BLUE SHIELD

## 2020-12-28 ENCOUNTER — Inpatient Hospital Stay (HOSPITAL_COMMUNITY)
Admission: AD | Admit: 2020-12-28 | Payer: BLUE CROSS/BLUE SHIELD | Source: Home / Self Care | Admitting: Obstetrics & Gynecology

## 2020-12-28 DIAGNOSIS — Z3483 Encounter for supervision of other normal pregnancy, third trimester: Secondary | ICD-10-CM | POA: Diagnosis not present

## 2020-12-31 ENCOUNTER — Inpatient Hospital Stay (HOSPITAL_COMMUNITY)
Admission: AD | Admit: 2020-12-31 | Discharge: 2021-01-03 | DRG: 807 | Disposition: A | Discharge status: Home / Self Care | Payer: BLUE CROSS/BLUE SHIELD | Attending: Obstetrics & Gynecology | Admitting: Obstetrics & Gynecology

## 2020-12-31 ENCOUNTER — Other Ambulatory Visit: Payer: Self-pay

## 2020-12-31 ENCOUNTER — Encounter (HOSPITAL_COMMUNITY): Payer: Self-pay | Admitting: Family Medicine

## 2020-12-31 ENCOUNTER — Inpatient Hospital Stay (HOSPITAL_COMMUNITY): Payer: BLUE CROSS/BLUE SHIELD

## 2020-12-31 DIAGNOSIS — O480 Post-term pregnancy: Secondary | ICD-10-CM | POA: Diagnosis not present

## 2020-12-31 DIAGNOSIS — O3663X0 Maternal care for excessive fetal growth, third trimester, not applicable or unspecified: Principal | ICD-10-CM | POA: Diagnosis present

## 2020-12-31 DIAGNOSIS — Z3A40 40 weeks gestation of pregnancy: Secondary | ICD-10-CM | POA: Diagnosis not present

## 2020-12-31 DIAGNOSIS — M79602 Pain in left arm: Secondary | ICD-10-CM | POA: Diagnosis not present

## 2020-12-31 DIAGNOSIS — O99355 Diseases of the nervous system complicating the puerperium: Secondary | ICD-10-CM | POA: Diagnosis not present

## 2020-12-31 DIAGNOSIS — O99893 Other specified diseases and conditions complicating puerperium: Secondary | ICD-10-CM | POA: Diagnosis not present

## 2020-12-31 DIAGNOSIS — G8918 Other acute postprocedural pain: Secondary | ICD-10-CM | POA: Diagnosis not present

## 2020-12-31 LAB — CBC
HCT: 38.8 % (ref 36.0–46.0)
Hemoglobin: 13 g/dL (ref 12.0–15.0)
MCH: 30.2 pg (ref 26.0–34.0)
MCHC: 33.5 g/dL (ref 30.0–36.0)
MCV: 90 fL (ref 80.0–100.0)
Platelets: 319 10*3/uL (ref 150–400)
RBC: 4.31 MIL/uL (ref 3.87–5.11)
RDW: 15.2 % (ref 11.5–15.5)
WBC: 8.1 10*3/uL (ref 4.0–10.5)
nRBC: 0 % (ref 0.0–0.2)

## 2020-12-31 LAB — TYPE AND SCREEN
ABO/RH(D): O POS
Antibody Screen: NEGATIVE

## 2020-12-31 LAB — RPR: RPR Ser Ql: NONREACTIVE

## 2020-12-31 MED ORDER — TERBUTALINE SULFATE 1 MG/ML IJ SOLN: 0.2500 mg | Freq: Once | INTRAMUSCULAR | Status: DC | PRN

## 2020-12-31 MED ORDER — OXYTOCIN-SODIUM CHLORIDE 30-0.9 UT/500ML-% IV SOLN
2.5000 [IU]/h | INTRAVENOUS | Status: DC
  Administered 2021-01-01: 2.5 [IU]/h via INTRAVENOUS

## 2020-12-31 MED ORDER — ONDANSETRON HCL 4 MG/2ML IJ SOLN
4.0000 mg | Freq: Four times a day (QID) | INTRAMUSCULAR | Status: DC | PRN
Start: 2020-12-31 — End: 2021-01-01

## 2020-12-31 MED ORDER — OXYTOCIN-SODIUM CHLORIDE 30-0.9 UT/500ML-% IV SOLN
1.0000 m[IU]/min | INTRAVENOUS | Status: DC
Start: 2020-12-31 — End: 2021-01-01
  Administered 2020-12-31: 2 m[IU]/min via INTRAVENOUS
  Filled 2020-12-31: qty 500

## 2020-12-31 MED ORDER — OXYCODONE-ACETAMINOPHEN 5-325 MG PO TABS
2.0000 | ORAL_TABLET | ORAL | Status: DC | PRN
  Administered 2021-01-01: 2 via ORAL
  Filled 2020-12-31: qty 2

## 2020-12-31 MED ORDER — LIDOCAINE HCL (PF) 1 % IJ SOLN
30.0000 mL | INTRAMUSCULAR | Status: DC | PRN
Start: 2020-12-31 — End: 2021-01-01

## 2020-12-31 MED ORDER — LACTATED RINGERS IV SOLN: 500.0000 mL | INTRAVENOUS | Status: DC | PRN

## 2020-12-31 MED ORDER — FENTANYL CITRATE (PF) 100 MCG/2ML IJ SOLN: 100.0000 ug | INTRAMUSCULAR | Status: DC | PRN

## 2020-12-31 MED ORDER — SOD CITRATE-CITRIC ACID 500-334 MG/5ML PO SOLN: 30.0000 mL | ORAL | Status: DC | PRN

## 2020-12-31 MED ORDER — MISOPROSTOL 25 MCG QUARTER TABLET
25.0000 ug | ORAL_TABLET | ORAL | Status: DC | PRN
  Administered 2020-12-31: 25 ug via VAGINAL
  Filled 2020-12-31: qty 1

## 2020-12-31 MED ORDER — LACTATED RINGERS IV SOLN: INTRAVENOUS | Status: DC

## 2020-12-31 MED ORDER — OXYCODONE-ACETAMINOPHEN 5-325 MG PO TABS: 1.0000 | ORAL_TABLET | ORAL | Status: DC | PRN

## 2020-12-31 MED ORDER — ACETAMINOPHEN 325 MG PO TABS: 650.0000 mg | ORAL_TABLET | ORAL | Status: DC | PRN

## 2020-12-31 MED ORDER — OXYTOCIN BOLUS FROM INFUSION
333.0000 mL | Freq: Once | INTRAVENOUS | Status: AC
  Administered 2021-01-01: 333 mL via INTRAVENOUS

## 2020-12-31 NOTE — Progress Notes (Addendum)
LABOR PROGRESS NOTE  Wanda Norton is a 35 y.o. [id number] at [redacted]w[redacted]d admitted for scheduled IOL 2/2 LGA.  Subjective: Pt doing well. Starting to feel painful contractions more frequently. Reports pain is manageable. No LOF.  Objective: BP (!) 101/58   Pulse 85   Temp 97.9 F (36.6 C) (Oral)   Resp 18   Ht 5\' 3"  (1.6 m)   Wt 83.4 kg   SpO2 99%   BMI 32.58 kg/m    Dilation: 5.5 Effacement (%): 50 Cervical Position: Middle Station: -2 Presentation: Vertex Exam by:: E. Rothermel RN Fetal monitoring: Baseline: 135 bpm, Variability: Good {> 6 bpm), Accelerations: Reactive, and Decelerations: Absent Uterine activity: Frequency: q3-4 minutes, Duration: 1 minute, and Intensity: mild  Labs: Lab Results  Component Value Date   WBC 8.1 12/31/2020   HGB 13.0 12/31/2020   HCT 38.8 12/31/2020   MCV 90.0 12/31/2020   PLT 319 12/31/2020    Patient Active Problem List   Diagnosis Date Noted  . Post term pregnancy over 40 weeks 12/31/2020  . Pregnancy 09/23/2016  . Encounter for insertion of mirena IUD 10/08/2011  . Contraception management 08/07/2011    Assessment / Plan: Elective nduction of labor due to LGA   #Labor: Progressing on Pitocin, will continue to increase then consider AROM. Cytotec x1 at 0900. S/p FB, removed at 1305. Pitocin started at 1400, now @ 12 mu/min #Suspected LGA: EFW 3997g, 97%ile @[redacted]w[redacted]d ; shoulder dystocia precautions at delivery  #Fetal Wellbeing:  Category I #Pain Control: IV pain meds as needed #ID: GBS negative #Anticipated MOD: NSVD   08/09/2011 MD, PGY-1 Family Medicine Resident, [facility] Faculty Teaching Service  12/31/2020, 6:20 PM    I saw and evaluated the patient. I agree with the findings and the plan of care as documented in the resident's note.  [facility], MD [facility] Family Medicine Fellow, [facility] for [facility], [facility]

## 2020-12-31 NOTE — Progress Notes (Addendum)
LABOR PROGRESS NOTE  Wanda Norton is a 35 y.o. [id number] at [redacted]w[redacted]d admitted for scheduled IOL 2/2 LGA.  Subjective: Pt doing well. Not currently feeling any contractions. Denies pain. No LOF or bleeding.  Objective: BP (!) 93/49   Pulse 93   Temp 97.9 F (36.6 C) (Oral)   Resp 18   Ht 5\' 3"  (1.6 m)   Wt 83.4 kg   SpO2 99%   BMI 32.58 kg/m    Dilation: 5 Effacement (%): 50 Cervical Position: Posterior Station: -3 Presentation: Vertex Exam by:: Anaiya Wisinski MD Fetal monitoring: Baseline: 130 bpm, Variability: Good {> 6 bpm), Accelerations: Reactive, and Decelerations: Absent Uterine activity: Frequency: n/a, Duration: n/a, and Intensity: n/a   Labs: Lab Results  Component Value Date   WBC 8.1 12/31/2020   HGB 13.0 12/31/2020   HCT 38.8 12/31/2020   MCV 90.0 12/31/2020   PLT 319 12/31/2020    Patient Active Problem List   Diagnosis Date Noted  . Post term pregnancy over 40 weeks 12/31/2020  . Pregnancy 09/23/2016  . Encounter for insertion of mirena IUD 10/08/2011  . Contraception management 08/07/2011    Assessment / Plan: Induction of labor due to LGA, progressing well, pitocin started at 1400  #Labor: Progressing on Pitocin, will continue to increase then AROM. Cytotec x1 at 0900. S/p FB, removed at 1305. Pitocin @ 2 mu/min #Suspected LGA: EFW 3997g, 97%ile @[redacted]w[redacted]d ; shoulder dystocia precautions at delivery  #Fetal Wellbeing:  Category I #Pain Control: IV pain meds #ID: GBS negative #Anticipated MOD: NSVD   08/09/2011 MD, PGY-1 Family Medicine Resident, [facility] Faculty Teaching Service  12/31/2020, 2:27 PM    I saw and evaluated the patient. I agree with the findings and the plan of care as documented in the resident's note.  [facility], MD [facility] Family Medicine Fellow, [facility] for [facility], [facility]

## 2020-12-31 NOTE — H&P (Addendum)
OBSTETRIC ADMISSION HISTORY AND PHYSICAL  Wanda Norton is a 35 y.o. female [phone] with IUP at [redacted]w[redacted]d by LMP 03/23/20 presenting for scheduled IOL and delivery secondary to LGA. She reports +FMs, No LOF, no VB, no blurry vision, headaches or peripheral edema, and RUQ pain.  She plans on breast and bottle feeding. She has vasectomy in partner for birth control She received her prenatal care at [facility]   Dating: By LMP 03/23/20 --->  Estimated Date of Delivery: 12/27/20  Sono: @[redacted]w[redacted]d , CWD, normal anatomy, cephalic presentation, vertex lie, 3997g, 97% EFW   Prenatal History/Complications:  Past Medical History: Past Medical History:  Diagnosis Date  . Headache     Past Surgical History: Past Surgical History:  Procedure Laterality Date  . NO PAST SURGERIES      Obstetrical History: OB History    Gravida  5   Para  3   Term  3   Preterm  0   AB  1   Living  3     SAB  1   IAB  0   Ectopic  0   Multiple  0   Live Births  3           Social History Social History   Socioeconomic History  . Marital status: Married    Spouse name: Not on file  . Number of children: Not on file  . Years of education: Not on file  . Highest education level: Not on file  Occupational History  . Not on file  Tobacco Use  . Smoking status: Never Smoker  . Smokeless tobacco: Never Used  Substance and Sexual Activity  . Alcohol use: No  . Drug use: No  . Sexual activity: Yes    Birth control/protection: None    Comment: Mirena x60months  Other Topics Concern  . Not on file  Social History Narrative  . Not on file   Social Determinants of Health   Financial Resource Strain: Not on file  Food Insecurity: Not on file  Transportation Needs: Not on file  Physical Activity: Not on file  Stress: Not on file  Social Connections: Not on file    Family History: Family History  Problem Relation Age of Onset  . Alcohol abuse Neg Hx   . Arthritis Neg Hx   . Asthma Neg Hx   .  Birth defects Neg Hx   . Cancer Neg Hx   . COPD Neg Hx   . Depression Neg Hx   . Diabetes Neg Hx   . Drug abuse Neg Hx   . Early death Neg Hx   . Hearing loss Neg Hx   . Heart disease Neg Hx   . Hyperlipidemia Neg Hx   . Hypertension Neg Hx   . Kidney disease Neg Hx   . Learning disabilities Neg Hx   . Mental illness Neg Hx   . Mental retardation Neg Hx   . Miscarriages / Stillbirths Neg Hx   . Stroke Neg Hx   . Vision loss Neg Hx   . Varicose Veins Neg Hx     Allergies: No Known Allergies  Medications Prior to Admission  Medication Sig Dispense Refill Last Dose  . dicloxacillin (DYNAPEN) 500 MG capsule Take 1 capsule (500 mg total) by mouth 4 (four) times daily. 28 capsule 0   . docusate sodium (COLACE) 100 MG capsule Take 1 capsule (100 mg total) by mouth 2 (two) times daily. 30 capsule 0   . ibuprofen (  ADVIL,MOTRIN) 600 MG tablet Take 1 tablet (600 mg total) by mouth every 6 (six) hours. 30 tablet 0   . Prenatal Vit-Fe Fumarate-FA (PRENATAL MULTIVITAMIN) TABS tablet Take 1 tablet by mouth at bedtime.        Review of Systems   All systems reviewed and negative except as stated in HPI  Blood pressure 106/67, pulse 77, temperature 98.4 F (36.9 C), temperature source Oral, resp. rate 18, height 5\' 3"  (1.6 m), weight 83.4 kg, SpO2 99 %, unknown if currently breastfeeding.   General appearance: alert and cooperative Lungs: no increased WOB Heart: regular rate and rhythm Abdomen: soft, non-tender; size appropriate for gestational age Presentation: cephalic Fetal monitoringBaseline: 130 bpm, Variability: Good {> 6 bpm), Accelerations: Reactive and no decels Uterine activity: minimal irregular contractions   Prenatal labs: ABO, Rh:  O Positive (06/25/20) Antibody:  Negative (06/25/20) Rubella:  Immune (06/25/20) RPR:   Non-reactive (06/25/20) HBsAg:   Non-reactive (06/25/20) HIV:   Non-reactive (06/25/20) GBS: GBS negative (12/03/20)    1 hr glucose: 160  (abnormal) 3 hr glucose: 117 (normal) Genetic screening: Declined Anatomy 12/05/20: WNL per [facility] department records of limited findings  Prenatal Transfer Tool  Maternal Diabetes: No Genetic Screening: Declined Maternal Ultrasounds/Referrals: Normal Fetal Ultrasounds or other Referrals:  None Maternal Substance Abuse:  No Significant Maternal Medications:  None Significant Maternal Lab Results: None  No results found for this or any previous visit (from the past 24 hour(s)).  Patient Active Problem List   Diagnosis Date Noted  . Pregnancy 09/23/2016  . Encounter for insertion of mirena IUD 10/08/2011  . Contraception management 08/07/2011    Assessment/Plan:  Wanda Norton is a 35 y.o. 31 at [redacted]w[redacted]d here for scheduled IOL for PD and suspected LGA.   #Labor: Here for scheduled IOL. Miso PV given at 0913, FB placed at 0945. #Pain: TBD. Pt interested in epidural "if necessary" #FWB: Category 1 strip #ID: GBS, GC, CT negative 12/03/20 #MOF: Breast and bottle #MOC: Vasectomy in partner. Does not have backup method and partner's procedure not yet scheduled, encouraged to consider backup. #Circ:  N/A  12/05/20, Medical Student  12/31/2020, 6:50 AM   ATTENDING ATTESTATION  I have seen and examined this patient and agree with the above documentation in the medical student's note except as below.  I repeated all portions of history and physical exam, personally placed FB, and edited the above note.   01/02/2021, MD/[facility] for [facility] (Faculty Practice) 12/31/2020, 9:49 AM

## 2020-12-31 NOTE — Progress Notes (Signed)
Wanda Norton is a 36 y.o. [id number] at [redacted]w[redacted]d admitted for elective IOL  Subjective: Patient is doing well. Reporting more frequent and painful contractions.  Objective: BP (!) 98/53   Pulse 79   Temp 99 F (37.2 C) (Oral)   Resp 18   Ht 5\' 3"  (1.6 m)   Wt 83.4 kg   SpO2 99%   BMI 32.58 kg/m  No intake/output data recorded. No intake/output data recorded.  FHT:  FHR: 130 bpm, variability: moderate,  accelerations:  Present,  decelerations:  Absent UC: Q 3-4 mins SVE:   Dilation: 6 Effacement (%): 50 Station: -2 Exam by:: [email or web address], CNM  Labs: Lab Results  Component Value Date   WBC 8.1 12/31/2020   HGB 13.0 12/31/2020   HCT 38.8 12/31/2020   MCV 90.0 12/31/2020   PLT 319 12/31/2020    Assessment / Plan: Wanda Norton 35 y.o. Wanda Norton at [redacted]w[redacted]d  Labor: Currently on Pitocin 51mu/min. Continue titration prn per unit policy. We discussed risks/benefits of AROM. Patient gives verbal consent. AROM with meconium-stained fluid. Patient and baby tolerated procedure well.  Suspected LGA: previously discussed shoulder dystocia precautions. Two stools present in room.  Fetal Wellbeing:  Category I Pain Control:  desires unmedicated. IV fentanyl prn I/D:  GBS negative Anticipated MOD:  NSVD  16m, MSN, CNM 12/31/2020, 10:12 PM

## 2021-01-01 ENCOUNTER — Encounter (HOSPITAL_COMMUNITY): Payer: Self-pay | Admitting: Family Medicine

## 2021-01-01 DIAGNOSIS — O3663X0 Maternal care for excessive fetal growth, third trimester, not applicable or unspecified: Secondary | ICD-10-CM

## 2021-01-01 DIAGNOSIS — Z3A40 40 weeks gestation of pregnancy: Secondary | ICD-10-CM

## 2021-01-01 DIAGNOSIS — O480 Post-term pregnancy: Secondary | ICD-10-CM

## 2021-01-01 MED ORDER — SENNOSIDES-DOCUSATE SODIUM 8.6-50 MG PO TABS
2.0000 | ORAL_TABLET | Freq: Every day | ORAL | Status: DC
  Administered 2021-01-02 – 2021-01-03 (×2): 2 via ORAL
  Filled 2021-01-01 (×2): qty 2

## 2021-01-01 MED ORDER — ACETAMINOPHEN 325 MG PO TABS
650.0000 mg | ORAL_TABLET | ORAL | Status: DC | PRN
  Administered 2021-01-01 (×2): 650 mg via ORAL
  Filled 2021-01-01 (×2): qty 2

## 2021-01-01 MED ORDER — DIBUCAINE (PERIANAL) 1 % EX OINT: 1.0000 "application " | TOPICAL_OINTMENT | CUTANEOUS | Status: DC | PRN

## 2021-01-01 MED ORDER — WITCH HAZEL-GLYCERIN EX PADS: 1.0000 "application " | MEDICATED_PAD | CUTANEOUS | Status: DC | PRN

## 2021-01-01 MED ORDER — PRENATAL MULTIVITAMIN CH
1.0000 | ORAL_TABLET | Freq: Every day | ORAL | Status: DC
  Administered 2021-01-01 – 2021-01-03 (×3): 1 via ORAL
  Filled 2021-01-01 (×3): qty 1

## 2021-01-01 MED ORDER — COCONUT OIL OIL: 1.0000 "application " | TOPICAL_OIL | Status: DC | PRN

## 2021-01-01 MED ORDER — IBUPROFEN 600 MG PO TABS
600.0000 mg | ORAL_TABLET | Freq: Four times a day (QID) | ORAL | Status: DC
Start: 2021-01-01 — End: 2021-01-03
  Administered 2021-01-01 – 2021-01-03 (×10): 600 mg via ORAL
  Filled 2021-01-01 (×10): qty 1

## 2021-01-01 MED ORDER — DIPHENHYDRAMINE HCL 25 MG PO CAPS: 25.0000 mg | ORAL_CAPSULE | Freq: Four times a day (QID) | ORAL | Status: DC | PRN

## 2021-01-01 MED ORDER — ONDANSETRON HCL 4 MG PO TABS: 4.0000 mg | ORAL_TABLET | ORAL | Status: DC | PRN

## 2021-01-01 MED ORDER — ZOLPIDEM TARTRATE 5 MG PO TABS: 5.0000 mg | ORAL_TABLET | Freq: Every evening | ORAL | Status: DC | PRN

## 2021-01-01 MED ORDER — BENZOCAINE-MENTHOL 20-0.5 % EX AERO
1.0000 "application " | INHALATION_SPRAY | CUTANEOUS | Status: DC | PRN
  Administered 2021-01-01: 1 via TOPICAL
  Filled 2021-01-01: qty 56

## 2021-01-01 MED ORDER — TETANUS-DIPHTH-ACELL PERTUSSIS 5-2.5-18.5 LF-MCG/0.5 IM SUSY: 0.5000 mL | PREFILLED_SYRINGE | Freq: Once | INTRAMUSCULAR | Status: DC

## 2021-01-01 MED ORDER — ONDANSETRON HCL 4 MG/2ML IJ SOLN: 4.0000 mg | INTRAMUSCULAR | Status: DC | PRN

## 2021-01-01 MED ORDER — SIMETHICONE 80 MG PO CHEW: 80.0000 mg | CHEWABLE_TABLET | ORAL | Status: DC | PRN

## 2021-01-01 NOTE — Discharge Summary (Addendum)
Postpartum Discharge Summary     Patient Name: Wanda Norton DOB: 1986-06-19 MRN: [id number]  Date of admission: 12/31/2020 Delivery date:01/01/2021  Delivering provider: Renee Harder  Date of discharge: 01/03/2021  Admitting diagnosis: Post term pregnancy over 40 weeks [O48.0] Intrauterine pregnancy: [redacted]w[redacted]d    Secondary diagnosis:  Active Problems:   Post term pregnancy over 40 weeks  Additional problems:   Discharge diagnosis: Term Pregnancy Delivered                                              Post partum procedures:none Augmentation: AROM, Pitocin, Cytotec and IP Foley Complications: None  Hospital course: Induction of Labor With Vaginal Delivery   35y.o. yo [id number] admitted to the hospital 12/31/2020 for induction of labor.  Indication for induction: Elective.  Patient had an uncomplicated labor course as follows: Membrane Rupture Time/Date: 10:09 PM ,12/31/2020   Delivery Method:Vaginal, Spontaneous SVD Episiotomy: None  Lacerations:  None  Details of delivery can be found in separate delivery note. Patient had a routine postpartum course. Patient is discharged home 01/03/21.  Newborn Data: Birth date:01/01/2021  Birth time:1:20 AM  Gender:Female  Living status:Living  Apgars:8 ,9  Weight:4346 g (9lb 9.3oz)  Magnesium Sulfate received: No BMZ received: No Rhophylac:N/A MMR:N/A; immune T-DaP:Given prenatally Flu: No Transfusion:No  Physical exam  Vitals:   01/01/21 2100 01/02/21 0641 01/02/21 1345 01/03/21 0619  BP: 100/67 107/87 103/60 (!) 97/58  Pulse: 73 75 76 75  Resp: 18 16 18 15   Temp: 98.3 F (36.8 C) 97.8 F (36.6 C) 98.3 F (36.8 C) 97.6 F (36.4 C)  TempSrc: Oral Oral Oral Oral  SpO2:  99%  98%  Weight:      Height:       General: alert, cooperative and no distress Lochia: appropriate Uterine Fundus: firm Incision: N/A DVT Evaluation: No evidence of DVT seen on physical exam. Labs: Lab Results  Component Value  Date   WBC 8.1 12/31/2020   HGB 13.0 12/31/2020   HCT 38.8 12/31/2020   MCV 90.0 12/31/2020   PLT 319 12/31/2020   CMP Latest Ref Rng & Units 06/30/2014  Glucose 70 - 99 mg/dL 95  BUN 6 - 23 mg/dL 6  Creatinine 0.50 - 1.10 mg/dL 0.67  Sodium 137 - 147 mEq/L 140  Potassium 3.7 - 5.3 mEq/L 3.8  Chloride 96 - 112 mEq/L 104  CO2 19 - 32 mEq/L 23  Calcium 8.4 - 10.5 mg/dL 9.3  Total Protein 6.0 - 8.3 g/dL 7.9  Total Bilirubin 0.3 - 1.2 mg/dL 0.4  Alkaline Phos 39 - 117 U/L 62  AST 0 - 37 U/L 24  ALT 0 - 35 U/L 17   Edinburgh Score: Edinburgh Postnatal Depression Scale Screening Tool 01/01/2021  I have been able to laugh and see the funny side of things. 0  I have looked forward with enjoyment to things. 0  I have blamed myself unnecessarily when things went wrong. 0  I have been anxious or worried for no good reason. 0  I have felt scared or panicky for no good reason. 0  Things have been getting on top of me. 0  I have been so unhappy that I have had difficulty sleeping. 0  I have felt sad or miserable. 0  I have been so unhappy that I have  been crying. 0  The thought of harming myself has occurred to me. 0  Edinburgh Postnatal Depression Scale Total 0     After visit meds:  Allergies as of 01/03/2021   No Known Allergies     Medication List    STOP taking these medications   dicloxacillin 500 MG capsule Commonly known as: DYNAPEN     TAKE these medications   acetaminophen 325 MG tablet Commonly known as: Tylenol Take 2 tablets (650 mg total) by mouth every 4 (four) hours as needed (for pain scale < 4).   docusate sodium 100 MG capsule Commonly known as: Colace Take 1 capsule (100 mg total) by mouth 2 (two) times daily.   ibuprofen 600 MG tablet Commonly known as: ADVIL Take 1 tablet (600 mg total) by mouth every 6 (six) hours.   prenatal multivitamin Tabs tablet Take 1 tablet by mouth at bedtime.      Discharge home in stable condition Infant Feeding:  Bottle and Breast Infant Disposition:home with mother Discharge instruction: per After Visit Summary and Postpartum booklet. Activity: Advance as tolerated. Pelvic rest for 6 weeks.  Diet: routine diet Future Appointments:No future appointments. Follow up Visit:  Follow-up Information    Department, [facility]. Schedule an appointment as soon as possible for a visit in 4 week(s).   Why: for post-partum appt Contact information: [place] [address] [phone]               Note sent to: N/A, [facility] pt for pp visit. Pt instructed to call [facility] for PP follow up in 4-6 weeks  Low risk pregnancy complicated by: none Delivery mode:  Vaginal, Spontaneous  Anticipated Birth Control:  vasectomy   01/03/2021 Myrtis Ser, CNM  9:08 AM

## 2021-01-02 DIAGNOSIS — O99893 Other specified diseases and conditions complicating puerperium: Secondary | ICD-10-CM

## 2021-01-02 DIAGNOSIS — M79602 Pain in left arm: Secondary | ICD-10-CM

## 2021-01-02 NOTE — Progress Notes (Addendum)
POSTPARTUM PROGRESS NOTE  Post Partum Day 1  Subjective:  Wanda Norton is a 35 y.o. [id number] s/p SVD at [redacted]w[redacted]d.  She reports she is doing well. No acute events overnight. She denies any problems with ambulating, voiding or po intake. Denies nausea or vomiting. No BM, but has been passing gas. Pain is well controlled.  Lochia is minimal.  Reports some pain in L bicep that she thinks is from pulling herself up in bed during labor.  Objective: Blood pressure 107/87, pulse 75, temperature 97.8 F (36.6 C), temperature source Oral, resp. rate 16, height 5\' 3"  (1.6 m), weight 83.4 kg, SpO2 99 %, unknown if currently breastfeeding.  Physical Exam:  General: alert, cooperative and no distress Chest: no respiratory distress Heart:regular rate, distal pulses intact Abdomen: soft, nontender Uterine Fundus: firm, appropriately tender DVT Evaluation: No calf swelling or tenderness Extremities: no edema Skin: warm, dry  Recent Labs    12/31/20 0736  HGB 13.0  HCT 38.8    Assessment/Plan: Wanda Norton is a 35 y.o. 31 s/p SVD at [redacted]w[redacted]d   PPD#1 - Doing well. Continue routine postpartum care.  L bicep pain - discussed stretching and massage, may use tylenol/ibuprofen prn, informed that she may use ice as well Contraception: vasectomy, counseled on condoms and regular, daily breast feeding to avoid pregnancy until vasectomy done. Offered depo prior to discharge - she is considering.  Feeding: Both Dispo: Plan for discharge 5/26, pt reports she would like to stay one more day   LOS: 2 days    6/26 MD, PGY-1 OBGYN Faculty Teaching Service  01/02/2021, 7:23 AM   I saw and evaluated the patient. I agree with the findings and the plan of care as documented in the resident's note.  01/04/2021, MD [facility] Family Medicine Fellow, [facility] for [facility], [facility]

## 2021-01-03 DIAGNOSIS — G8918 Other acute postprocedural pain: Secondary | ICD-10-CM

## 2021-01-03 DIAGNOSIS — O99355 Diseases of the nervous system complicating the puerperium: Secondary | ICD-10-CM

## 2021-01-03 MED ORDER — ACETAMINOPHEN 325 MG PO TABS: 650.0000 mg | ORAL_TABLET | ORAL | Status: DC | PRN

## 2021-01-03 NOTE — Discharge Instructions (Signed)

## 2021-01-30 DIAGNOSIS — Z01419 Encounter for gynecological examination (general) (routine) without abnormal findings: Secondary | ICD-10-CM | POA: Diagnosis not present

## 2021-01-30 DIAGNOSIS — E669 Obesity, unspecified: Secondary | ICD-10-CM | POA: Diagnosis not present

## 2021-01-30 DIAGNOSIS — N898 Other specified noninflammatory disorders of vagina: Secondary | ICD-10-CM | POA: Diagnosis not present

## 2021-02-04 LAB — HM PAP SMEAR: HPV, high-risk: NEGATIVE

## 2021-08-27 ENCOUNTER — Other Ambulatory Visit: Payer: Self-pay

## 2021-08-27 ENCOUNTER — Encounter (HOSPITAL_COMMUNITY): Payer: Self-pay

## 2021-08-27 ENCOUNTER — Emergency Department (HOSPITAL_COMMUNITY)
Admission: EM | Admit: 2021-08-27 | Discharge: 2021-08-27 | Disposition: A | Discharge status: Home / Self Care | Payer: Medicaid Other | Attending: Emergency Medicine | Admitting: Emergency Medicine

## 2021-08-27 DIAGNOSIS — H9203 Otalgia, bilateral: Secondary | ICD-10-CM | POA: Insufficient documentation

## 2021-08-27 DIAGNOSIS — R059 Cough, unspecified: Secondary | ICD-10-CM | POA: Insufficient documentation

## 2021-08-27 DIAGNOSIS — J029 Acute pharyngitis, unspecified: Secondary | ICD-10-CM | POA: Insufficient documentation

## 2021-08-27 LAB — GROUP A STREP BY PCR: Group A Strep by PCR: NOT DETECTED

## 2021-08-27 MED ORDER — CETIRIZINE HCL 10 MG PO TABS: 10.0000 mg | ORAL_TABLET | Freq: Every day | ORAL | 0 refills | Status: DC

## 2021-08-27 MED ORDER — LIDOCAINE VISCOUS HCL 2 % MT SOLN: 15.0000 mL | Freq: Four times a day (QID) | OROMUCOSAL | 0 refills | Status: DC | PRN

## 2021-08-27 MED ORDER — LIDOCAINE VISCOUS HCL 2 % MT SOLN
15.0000 mL | Freq: Once | OROMUCOSAL | Status: AC
  Administered 2021-08-27: 15 mL via OROMUCOSAL
  Filled 2021-08-27: qty 15

## 2021-08-27 NOTE — ED Notes (Signed)
I provided reinforced discharge education based off of discharge instructions. Pt acknowledged and understood my education. Pt had no further questions/concerns for provider/myself.  °

## 2021-08-27 NOTE — Discharge Instructions (Signed)
You came to the [facility] today to be evaluated for your sore throat and ear pain.  Your strep test was negative.  Your physical exam was reassuring.  The exact cause of your symptoms are unknown at this time.  I have given you prescription for daily allergy medication as well as viscous lidocaine.  Please take these medications as prescribed.  If your symptoms do not improve please follow-up with your primary care provider or urgent care in the outpatient setting.  Get help right away if: You have trouble breathing. You cannot swallow fluids, soft foods, or your spit. You have swelling in your throat or neck that gets worse. You feel like you may vomit (nauseous) and this feeling lasts a long time. You cannot stop vomiting. Have a severe headache. Have a stiff neck. Have redness or swelling behind your ear. Have fluid or blood coming from your ear. Have hearing loss. Feel dizzy.

## 2021-08-27 NOTE — ED Provider Notes (Signed)
[facility] DEPT Provider Note   CSN: [id number] Arrival date & time: 08/27/21  1042     History  Chief Complaint  Patient presents with   Sore Throat   Otalgia    Wanda Norton is a 36 y.o. female with no pertinent past medical history.  Presents to the emergency department with a chief complaint of sore throat and bilateral otalgia.  Patient reports that her symptoms have been present over the last 10 days.  Patient states that sore throat has gotten gradually worse over this time.  Patient states that pain is worse with swallowing or talking.  Patient states that she has had improvement in her pain with Tylenol.  Patient rates her pain 4/10 on the pain scale.  Patient denies any trismus, drooling, trouble swallowing, trouble breathing, high potato voice, neck pain, neck stiffness, fever, chills.  Patient reports that originally she had pain to her left ear.  Patient states that she is currently only having pain to her right ear.  Patient rates her pain 3/10 on the pain scale.  Patient denies any aggravating factors.  Patient states that she did have improvement in her pain when taking Tylenol.  Patient denies any recent swimming, flights, or barotrauma.  Denies any otorrhea or hearing loss.  Patient states that she has had an intermittent cough over the last 10 days.  States her cough is producing yellow mucus.  Denies any known sick contacts.   Sore Throat Pertinent negatives include no chest pain, no abdominal pain, no headaches and no shortness of breath.  Otalgia Associated symptoms: cough and sore throat   Associated symptoms: no abdominal pain, no congestion, no ear discharge, no fever, no headaches, no neck pain, no rash, no rhinorrhea, no tinnitus and no vomiting       Home Medications Prior to Admission medications   Medication Sig Start Date End Date Taking? Authorizing Provider  acetaminophen (TYLENOL) 325 MG tablet Take 2 tablets (650 mg  total) by mouth every 4 (four) hours as needed (for pain scale < 4). 01/03/21   Mee Hives, MD  docusate sodium (COLACE) 100 MG capsule Take 1 capsule (100 mg total) by mouth 2 (two) times daily. 09/25/16   Mumaw, Lauralyn Primes, DO  ibuprofen (ADVIL,MOTRIN) 600 MG tablet Take 1 tablet (600 mg total) by mouth every 6 (six) hours. 09/25/16   Mumaw, Lauralyn Primes, DO  Prenatal Vit-Fe Fumarate-FA (PRENATAL MULTIVITAMIN) TABS tablet Take 1 tablet by mouth at bedtime.    [provider]      Allergies    Patient has no known allergies.    Review of Systems   Review of Systems  Constitutional:  Negative for chills and fever.  HENT:  Positive for ear pain and sore throat. Negative for congestion, dental problem, drooling, ear discharge, facial swelling, nosebleeds, rhinorrhea, sinus pressure, sinus pain, tinnitus and trouble swallowing.   Eyes:  Negative for visual disturbance.  Respiratory:  Positive for cough. Negative for shortness of breath.   Cardiovascular:  Negative for chest pain.  Gastrointestinal:  Negative for abdominal pain, nausea and vomiting.  Musculoskeletal:  Negative for back pain and neck pain.  Skin:  Negative for color change and rash.  Neurological:  Negative for dizziness, syncope, light-headedness and headaches.  Psychiatric/Behavioral:  Negative for confusion.    Physical Exam Updated Vital Signs BP 120/88 (BP Location: Left Arm)    Pulse 76    Temp 98.5 F (36.9 C) (Oral)  Resp 16    Ht 5\' 2"  (1.575 m)    Wt 77.1 kg    SpO2 96%    Breastfeeding Yes    BMI 31.09 kg/m  Physical Exam Vitals and nursing note reviewed.  Constitutional:      General: She is not in acute distress.    Appearance: She is not ill-appearing, toxic-appearing or diaphoretic.  HENT:     Head: Normocephalic. No raccoon eyes, Battle's sign, abrasion, contusion, masses, right periorbital erythema, left periorbital erythema or laceration.     Jaw: No trismus or pain on  movement.     Right Ear: Tympanic membrane, ear canal and external ear normal. No laceration, drainage, swelling or tenderness. No mastoid tenderness.     Left Ear: Tympanic membrane, ear canal and external ear normal. No laceration, drainage, swelling or tenderness. No mastoid tenderness.     Ears:     Comments: No auricle tenderness bilaterally.  No auricle proptosis bilaterally.    Mouth/Throat:     Lips: Pink. No lesions.     Mouth: Mucous membranes are moist.     Tongue: No lesions. Tongue does not deviate from midline.     Palate: No mass and lesions.     Pharynx: Oropharynx is clear. Uvula midline. No pharyngeal swelling, oropharyngeal exudate, posterior oropharyngeal erythema or uvula swelling.     Tonsils: No tonsillar exudate or tonsillar abscesses. 1+ on the right. 1+ on the left.     Comments: Cobblestoning to posterior oropharynx Eyes:     General: No scleral icterus.       Right eye: No discharge.        Left eye: No discharge.  Neck:     Comments: No swelling to submandibular space Cardiovascular:     Rate and Rhythm: Normal rate.  Pulmonary:     Effort: Pulmonary effort is normal. No tachypnea, bradypnea or respiratory distress.     Breath sounds: Normal breath sounds. No stridor.  Musculoskeletal:     Cervical back: Full passive range of motion without pain, normal range of motion and neck supple. No edema, erythema, signs of trauma, rigidity, torticollis or crepitus. No pain with movement, spinous process tenderness or muscular tenderness. Normal range of motion.  Lymphadenopathy:     Cervical: No cervical adenopathy.  Skin:    General: Skin is warm and dry.  Neurological:     General: No focal deficit present.     Mental Status: She is alert.  Psychiatric:        Behavior: Behavior is cooperative.    ED Results / Procedures / Treatments   Labs (all labs ordered are listed, but only abnormal results are displayed) Labs Reviewed  GROUP A STREP BY PCR     EKG None  Radiology No results found.  Procedures Procedures    Medications Ordered in ED Medications  lidocaine (XYLOCAINE) 2 % viscous mouth solution 15 mL (15 mLs Mouth/Throat Given 08/27/21 1253)    ED Course/ Medical Decision Making/ A&P                           Medical Decision Making Risk OTC drugs. Prescription drug management.   Alert 37 year old female no acute distress, nontoxic appearing.  Patient presents to the emergency department with complaints of sore throat and bilateral otalgia.  Patient was offered to conduct interview with translator however declines at this time.  Chart review was conducted and previous provider notes were  reviewed.  Rapid strep PCR was obtained and results independently reviewed by myself.  Patient is negative for strep pharyngitis  Patient has no swelling to submandibular space.  No pain with passive range of motion of neck.  No hot potato voice.  Handles oral secretions without difficulty.  Low suspicion for epiglottitis, deep space neck infection, or Ludewig's angina.  Patient has no swelling, exudate or erythema to tonsils bilaterally or oropharynx.  Uvula midline.  low suspicion for strep pharyngitis or peritonsillar abscess.  Patient has no mastoid tenderness bilaterally or auricle proptosis.  Low suspicion for mastoiditis.  No signs of acute otitis media or otitis externa.  Patient was ordered viscous lidocaine for sore throat with improvement in her sore throat.  Will prescribe patient with short course of the same.  Due to cobblestoning noted to posterior oropharynx will prescribe patient with Zyrtec.  Patient advised to follow-up with PCP if symptoms do not improve.Discussed results, findings, treatment and follow up. Patient advised of return precautions. Patient verbalized understanding and agreed with plan.         Final Clinical Impression(s) / ED Diagnoses Final diagnoses:  Sore throat  Otalgia of both  ears    Rx / DC Orders ED Discharge Orders          Ordered    cetirizine (ZYRTEC ALLERGY) 10 MG tablet  Daily        08/27/21 1257    lidocaine (XYLOCAINE) 2 % solution  Every 6 hours PRN        08/27/21 1257              Loni Beckwith, PA-C 08/28/21 0049    Valarie Merino, MD 08/28/21 2252

## 2021-08-27 NOTE — ED Triage Notes (Signed)
Patient c/o sore throat and bilateral ear pain x 10 days. Patient is able to talk and swallow without difficulty.

## 2021-09-24 ENCOUNTER — Other Ambulatory Visit: Payer: Self-pay

## 2021-09-24 NOTE — Progress Notes (Signed)
New Patient Office Visit  Subjective:  Patient ID: Wanda Norton, female    DOB: 16-Oct-1985  Age: 36 y.o. MRN: [id number]  CC:  Chief Complaint  Patient presents with   Establish Care    Np. Est care. No main concerns. CPE.     HPI Wanda Norton presents for new patient visit to establish care.  Introduced to Publishing rights manager role and practice setting.  All questions answered.  Discussed provider/patient relationship and expectations.  She has no concerns today and would like a physical.   Depression and anxiety screening done and shows:   Depression screen [facility] 2/9 09/25/2021  Decreased Interest 2  Down, Depressed, Hopeless 0  PHQ - 2 Score 2  Altered sleeping 0  Tired, decreased energy 0  Change in appetite 0  Feeling bad or failure about yourself  0  Trouble concentrating 0  Moving slowly or fidgety/restless 0  Suicidal thoughts 0  PHQ-9 Score 2  Difficult doing work/chores Not difficult at all   GAD 7 : Generalized Anxiety Score 09/25/2021  Nervous, Anxious, on Edge 0  Control/stop worrying 1  Worry too much - different things 2  Trouble relaxing 0  Restless 0  Easily annoyed or irritable 0  Afraid - awful might happen 0  Total GAD 7 Score 3  Anxiety Difficulty Not difficult at all    Past Medical History:  Diagnosis Date   Headache     Past Surgical History:  Procedure Laterality Date   NO PAST SURGERIES      Family History  Problem Relation Age of Onset   Alcohol abuse Neg Hx    Arthritis Neg Hx    Asthma Neg Hx    Birth defects Neg Hx    Cancer Neg Hx    COPD Neg Hx    Depression Neg Hx    Diabetes Neg Hx    Drug abuse Neg Hx    Early death Neg Hx    Hearing loss Neg Hx    Heart disease Neg Hx    Hyperlipidemia Neg Hx    Hypertension Neg Hx    Kidney disease Neg Hx    Learning disabilities Neg Hx    Mental illness Neg Hx    Mental retardation Neg Hx    Miscarriages / Stillbirths Neg Hx    Stroke Neg Hx    Vision loss Neg Hx     Varicose Veins Neg Hx     Social History   Socioeconomic History   Marital status: Married    Spouse name: Not on file   Number of children: Not on file   Years of education: Not on file   Highest education level: Not on file  Occupational History   Not on file  Tobacco Use   Smoking status: Never   Smokeless tobacco: Never  Vaping Use   Vaping Use: Never used  Substance and Sexual Activity   Alcohol use: No   Drug use: No   Sexual activity: Yes  Other Topics Concern   Not on file  Social History Narrative   Not on file   Social Determinants of Health   Financial Resource Strain: Not on file  Food Insecurity: Not on file  Transportation Needs: Not on file  Physical Activity: Not on file  Stress: Not on file  Social Connections: Not on file  Intimate Partner Violence: Not on file    ROS Review of Systems  Constitutional: Negative.   HENT: Negative.  Eyes: Negative.   Respiratory: Negative.    Cardiovascular: Negative.   Gastrointestinal: Negative.   Endocrine: Negative.   Genitourinary: Negative.   Musculoskeletal:  Positive for back pain (intermittent).  Skin: Negative.   Neurological: Negative.   Psychiatric/Behavioral: Negative.     Objective:   Today's Vitals: BP 97/64    Pulse 74    Temp (!) 96.5 F (35.8 C) (Temporal)    Ht 5\' 3"  (1.6 m)    Wt 168 lb 9.6 oz (76.5 kg)    SpO2 97%    Breastfeeding Yes    BMI 29.87 kg/m   Physical Exam Vitals and nursing note reviewed.  Constitutional:      General: She is not in acute distress.    Appearance: Normal appearance.  HENT:     Head: Normocephalic and atraumatic.     Right Ear: Tympanic membrane, ear canal and external ear normal.     Left Ear: Tympanic membrane, ear canal and external ear normal.     Nose: Nose normal.     Mouth/Throat:     Mouth: Mucous membranes are moist.     Pharynx: Oropharynx is clear.  Eyes:     Conjunctiva/sclera: Conjunctivae normal.  Cardiovascular:     Rate and  Rhythm: Normal rate and regular rhythm.     Pulses: Normal pulses.     Heart sounds: Normal heart sounds.  Pulmonary:     Effort: Pulmonary effort is normal.     Breath sounds: Normal breath sounds.  Abdominal:     General: Bowel sounds are normal.     Palpations: Abdomen is soft.     Tenderness: There is no abdominal tenderness.  Musculoskeletal:        General: Normal range of motion.     Cervical back: Normal range of motion. No tenderness.  Lymphadenopathy:     Cervical: No cervical adenopathy.  Skin:    General: Skin is warm and dry.  Neurological:     General: No focal deficit present.     Mental Status: She is alert and oriented to person, place, and time.     Cranial Nerves: No cranial nerve deficit.     Coordination: Coordination normal.     Gait: Gait normal.  Psychiatric:        Mood and Affect: Mood normal.        Behavior: Behavior normal.        Thought Content: Thought content normal.        Judgment: Judgment normal.    Assessment & Plan:   Problem List Items Addressed This Visit   None Visit Diagnoses     Routine general medical examination at a health care facility    -  Primary   Health maintenance reviewed and updated. Pap due next year. Check CMP, CBC today.    Relevant Orders   CBC with Differential/Platelet   Comprehensive metabolic panel   Encounter for lipid screening for cardiovascular disease       Screen lipid panel today   Relevant Orders   Lipid panel   Overweight (BMI 25.0-29.9)       BMI 29.8. Information given on diet and exercise. Will check A1C today per pt request.   Relevant Orders   Hemoglobin A1c   Birth control counseling       Discussed various methods of contraception. She would like to look at informatio and think about it.        LABORATORY TESTING:  - Pap  smear: up to date  IMMUNIZATIONS:   - Tdap: Tetanus vaccination status reviewed: last tetanus booster within 10 years. - Influenza: Refused - Pneumovax: Not  applicable - Prevnar: Not applicable - HPV: Not applicable - Zostavax vaccine: Not applicable  SCREENING: -Mammogram: Not applicable  - Colonoscopy: Not applicable  - Bone Density: Not applicable  -Hearing Test: Not applicable  -Spirometry: Not applicable   PATIENT COUNSELING:   Advised to take 1 mg of folate supplement per day if capable of pregnancy.   Sexuality: Discussed sexually transmitted diseases, partner selection, use of condoms, avoidance of unintended pregnancy  and contraceptive alternatives.   Advised to avoid cigarette smoking.  I discussed with the patient that most people either abstain from alcohol or drink within safe limits (<=14/week and <=4 drinks/occasion for males, <=7/weeks and <= 3 drinks/occasion for females) and that the risk for alcohol disorders and other health effects rises proportionally with the number of drinks per week and how often a drinker exceeds daily limits.  Discussed cessation/primary prevention of drug use and availability of treatment for abuse.   Diet: Encouraged to adjust caloric intake to maintain  or achieve ideal body weight, to reduce intake of dietary saturated fat and total fat, to limit sodium intake by avoiding high sodium foods and not adding table salt, and to maintain adequate dietary potassium and calcium preferably from fresh fruits, vegetables, and low-fat dairy products.    stressed the importance of regular exercise  Injury prevention: Discussed safety belts, safety helmets, smoke detector, smoking near bedding or upholstery.   Dental health: Discussed importance of regular tooth brushing, flossing, and dental visits.    NEXT PREVENTATIVE PHYSICAL DUE IN 1 YEAR.  Outpatient Encounter Medications as of 09/25/2021  Medication Sig   Prenatal Vit-Fe Fumarate-FA (PRENATAL MULTIVITAMIN) TABS tablet Take 1 tablet by mouth at bedtime.   acetaminophen (TYLENOL) 325 MG tablet Take 2 tablets (650 mg total) by mouth every 4  (four) hours as needed (for pain scale < 4). (Patient not taking: Reported on 09/25/2021)   [DISCONTINUED] cetirizine (ZYRTEC ALLERGY) 10 MG tablet Take 1 tablet (10 mg total) by mouth daily. (Patient not taking: Reported on 09/25/2021)   [DISCONTINUED] docusate sodium (COLACE) 100 MG capsule Take 1 capsule (100 mg total) by mouth 2 (two) times daily. (Patient not taking: Reported on 09/25/2021)   [DISCONTINUED] ibuprofen (ADVIL,MOTRIN) 600 MG tablet Take 1 tablet (600 mg total) by mouth every 6 (six) hours. (Patient not taking: Reported on 09/25/2021)   [DISCONTINUED] lidocaine (XYLOCAINE) 2 % solution Use as directed 15 mLs in the mouth or throat every 6 (six) hours as needed for mouth pain. (Patient not taking: Reported on 09/25/2021)   No facility-administered encounter medications on file as of 09/25/2021.    Follow-up: Return in about 1 year (around 09/25/2022) for CPE.   Gerre Scull, NP

## 2021-09-25 ENCOUNTER — Encounter: Payer: Self-pay | Admitting: Nurse Practitioner

## 2021-09-25 ENCOUNTER — Ambulatory Visit: Payer: Medicaid Other | Admitting: Nurse Practitioner

## 2021-09-25 VITALS — BP 97/64 | HR 74 | Temp 96.5°F | Ht 63.0 in | Wt 168.6 lb

## 2021-09-25 DIAGNOSIS — Z3009 Encounter for other general counseling and advice on contraception: Secondary | ICD-10-CM

## 2021-09-25 DIAGNOSIS — Z0000 Encounter for general adult medical examination without abnormal findings: Secondary | ICD-10-CM

## 2021-09-25 DIAGNOSIS — Z13220 Encounter for screening for lipoid disorders: Secondary | ICD-10-CM | POA: Diagnosis not present

## 2021-09-25 DIAGNOSIS — Z136 Encounter for screening for cardiovascular disorders: Secondary | ICD-10-CM | POA: Diagnosis not present

## 2021-09-25 DIAGNOSIS — E663 Overweight: Secondary | ICD-10-CM

## 2021-09-25 LAB — CBC WITH DIFFERENTIAL/PLATELET
Basophils Absolute: 0 10*3/uL (ref 0.0–0.1)
Basophils Relative: 0.4 % (ref 0.0–3.0)
Eosinophils Absolute: 0.1 10*3/uL (ref 0.0–0.7)
Eosinophils Relative: 2.2 % (ref 0.0–5.0)
HCT: 37.4 % (ref 36.0–46.0)
Hemoglobin: 12.1 g/dL (ref 12.0–15.0)
Lymphocytes Relative: 33 % (ref 12.0–46.0)
Lymphs Abs: 1.6 10*3/uL (ref 0.7–4.0)
MCHC: 32.5 g/dL (ref 30.0–36.0)
MCV: 87.1 fl (ref 78.0–100.0)
Monocytes Absolute: 0.3 10*3/uL (ref 0.1–1.0)
Monocytes Relative: 5.7 % (ref 3.0–12.0)
Neutro Abs: 2.9 10*3/uL (ref 1.4–7.7)
Neutrophils Relative %: 58.7 % (ref 43.0–77.0)
Platelets: 239 10*3/uL (ref 150.0–400.0)
RBC: 4.29 Mil/uL (ref 3.87–5.11)
RDW: 14.2 % (ref 11.5–15.5)
WBC: 4.9 10*3/uL (ref 4.0–10.5)

## 2021-09-25 LAB — HEMOGLOBIN A1C: Hgb A1c MFr Bld: 5.8 % (ref 4.6–6.5)

## 2021-09-25 NOTE — Patient Instructions (Signed)
It was great to see you!  We are checking your labs today and will send the results through mychart/call you if you don't view them.   Let's follow-up in 1 year, sooner if you have concerns.  If a referral was placed today, you will be contacted for an appointment. Please note that routine referrals can sometimes take up to 3-4 weeks to process. Please call our office if you haven't heard anything after this time frame.  Take care,  Rodman Pickle, NP

## 2021-09-26 LAB — LIPID PANEL
Cholesterol: 180 mg/dL (ref 0–200)
HDL: 47.6 mg/dL (ref >39.00)
LDL Cholesterol: 109 mg/dL — ABNORMAL HIGH (ref 0–99)
NonHDL: 132.3
Total CHOL/HDL Ratio: 4
Triglycerides: 115 mg/dL (ref 0.0–149.0)
VLDL: 23 mg/dL (ref 0.0–40.0)

## 2021-09-26 LAB — COMPREHENSIVE METABOLIC PANEL
ALT: 13 U/L (ref 0–35)
AST: 15 U/L (ref 0–37)
Albumin: 4.4 g/dL (ref 3.5–5.2)
Alkaline Phosphatase: 70 U/L (ref 39–117)
BUN: 17 mg/dL (ref 6–23)
CO2: 33 mEq/L — ABNORMAL HIGH (ref 19–32)
Calcium: 9.3 mg/dL (ref 8.4–10.5)
Chloride: 103 mEq/L (ref 96–112)
Creatinine, Ser: 0.53 mg/dL (ref 0.40–1.20)
GFR: 119.28 mL/min (ref >60.00)
Glucose, Bld: 88 mg/dL (ref 70–99)
Potassium: 4.1 mEq/L (ref 3.5–5.1)
Sodium: 140 mEq/L (ref 135–145)
Total Bilirubin: 0.4 mg/dL (ref 0.2–1.2)
Total Protein: 6.9 g/dL (ref 6.0–8.3)

## 2021-12-30 NOTE — Progress Notes (Unsigned)
   Acute Office Visit  Subjective:     Patient ID: Wanda Norton, female    DOB: 1985-08-29, 36 y.o.   MRN: [id number]  No chief complaint on file.   HPI Patient is in today for itchy skin.   ROS      Objective:    There were no vitals taken for this visit. {Vitals History (Optional):23777}  Physical Exam  No results found for any visits on 12/31/21.      Assessment & Plan:   Problem List Items Addressed This Visit   None   No orders of the defined types were placed in this encounter.   No follow-ups on file.  Gerre Scull, NP

## 2021-12-31 ENCOUNTER — Ambulatory Visit: Payer: Medicaid Other | Admitting: Nurse Practitioner

## 2021-12-31 ENCOUNTER — Encounter: Payer: Self-pay | Admitting: Nurse Practitioner

## 2021-12-31 VITALS — BP 102/64 | HR 76 | Temp 97.1°F | Wt 168.2 lb

## 2021-12-31 DIAGNOSIS — L853 Xerosis cutis: Secondary | ICD-10-CM

## 2021-12-31 MED ORDER — LORATADINE 10 MG PO TABS: 10.0000 mg | ORAL_TABLET | Freq: Every day | ORAL | 3 refills | Status: DC

## 2021-12-31 NOTE — Patient Instructions (Addendum)
It was great to see you!  Start claritin (loratidine) or zyrtec (cetirizine) 10mg  daily to help with itching.I have sent this to your pharmacy to see if it will be covered with insurance.   Make sure you are using lotion every day. Good options include eurcerin, jergens daily lotion specifically for sensitive skin. You can also increase your shea butter every day.   Selsun blue is another shampoo that helps with dandriff.   Biotin vitamin is specific for skin, hair, and nails. You can also take a generic multivitamin daily.   Let's follow-up if your symptoms worsen or don't improve.   Take care,  , NP

## 2021-12-31 NOTE — Assessment & Plan Note (Signed)
She has been experience dry, itchy skin for the last 3 weeks.  No rashes, bite marks, abrasions noted.  She uses Shea butter lotion about once a week.  Encouraged her to drink plenty of water, increase the frequency of her lotion to daily.  We will have her also start loratadine 10 mg daily, prescription sent to the pharmacy.  She can try [facility] for her dandruff.  If she is having ongoing itching, dry skin will place referral to dermatology.

## 2022-01-24 DIAGNOSIS — Z3009 Encounter for other general counseling and advice on contraception: Secondary | ICD-10-CM | POA: Diagnosis not present

## 2022-02-04 DIAGNOSIS — Z3200 Encounter for pregnancy test, result unknown: Secondary | ICD-10-CM | POA: Diagnosis not present

## 2022-02-04 DIAGNOSIS — Z30017 Encounter for initial prescription of implantable subdermal contraceptive: Secondary | ICD-10-CM | POA: Diagnosis not present

## 2022-02-04 DIAGNOSIS — R87615 Unsatisfactory cytologic smear of cervix: Secondary | ICD-10-CM | POA: Diagnosis not present

## 2022-03-12 ENCOUNTER — Encounter: Payer: Self-pay | Admitting: Family Medicine

## 2022-03-12 ENCOUNTER — Ambulatory Visit: Payer: Medicaid Other | Admitting: Family Medicine

## 2022-03-12 VITALS — BP 116/68 | HR 90 | Temp 97.4°F | Ht 63.0 in | Wt 169.4 lb

## 2022-03-12 DIAGNOSIS — J069 Acute upper respiratory infection, unspecified: Secondary | ICD-10-CM

## 2022-03-12 DIAGNOSIS — B009 Herpesviral infection, unspecified: Secondary | ICD-10-CM

## 2022-03-12 LAB — POC COVID19 BINAXNOW: SARS Coronavirus 2 Ag: NEGATIVE

## 2022-03-12 MED ORDER — VALACYCLOVIR HCL 1 G PO TABS: 1000.0000 mg | ORAL_TABLET | Freq: Two times a day (BID) | ORAL | 3 refills | Status: AC

## 2022-03-12 NOTE — Progress Notes (Signed)
[facility] [address] [place] [address] Dept: [phone] Dept Fax: [phone]  Office Visit  Subjective:    Patient ID: Wanda Norton, female    DOB: September 15, 1985, 36 y.o..   MRN: [id number]  Chief Complaint  Patient presents with   Acute Visit    Co having cough/congestion and now has a sore/red area under nose.     Taken Robitussin with some relief.     History of Present Illness:  Patient is in today for assessment of a 1-week history of cough and chest congestion. She had been in [place] this past week with her children, visiting amusement parks. She did perform a home-COVID test last week. This was negative. About five days ago, she developed a sore under her right nostril. She ntoes she has similar issues about twice a year. She does get tingling that precedes the sore developing.  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   Dry skin 12/31/2021   Post term pregnancy over 40 weeks 12/31/2020   Pregnancy 09/23/2016   Encounter for insertion of mirena IUD 10/08/2011   Contraception management 08/07/2011   Past Surgical History:  Procedure Laterality Date   NO PAST SURGERIES     Family History  Problem Relation Age of Onset   Alcohol abuse Neg Hx    Arthritis Neg Hx    Asthma Neg Hx    Birth defects Neg Hx    Cancer Neg Hx    COPD Neg Hx    Depression Neg Hx    Diabetes Neg Hx    Drug abuse Neg Hx    Early death Neg Hx    Hearing loss Neg Hx    Heart disease Neg Hx    Hyperlipidemia Neg Hx    Hypertension Neg Hx    Kidney disease Neg Hx    Learning disabilities Neg Hx    Mental illness Neg Hx    Mental retardation Neg Hx    Miscarriages / Stillbirths Neg Hx    Stroke Neg Hx    Vision loss Neg Hx    Varicose Veins Neg Hx    Outpatient Medications Prior to Visit  Medication Sig Dispense Refill   acetaminophen  (TYLENOL) 325 MG tablet Take 2 tablets (650 mg total) by mouth every 4 (four) hours as needed (for pain scale < 4).     loratadine (CLARITIN) 10 MG tablet Take 1 tablet (10 mg total) by mouth daily. 90 tablet 3   Prenatal Vit-Fe Fumarate-FA (PRENATAL MULTIVITAMIN) TABS tablet Take 1 tablet by mouth at bedtime.     No facility-administered medications prior to visit.   No Known Allergies    Objective:   Today's Vitals   03/12/22 1454  BP: 116/68  Pulse: 90  Temp: (!) 97.4 F (36.3 C)  TempSrc: Temporal  SpO2: 94%  Weight: 169 lb 6.4 oz (76.8 kg)  Height: 5\' 3"  (1.6 m)   Body mass index is 30.01 kg/m.   General: Well developed, well nourished. No acute distress. HEENT: Normocephalic, non-traumatic. PERRL, EOMI. Conjunctiva clear. External ears normal. EAC and TMs normal bilaterally.   Nose clear without congestion or rhinorrhea. There is a cluster of 3-4 small vesicles under the right nostril. Mucous membranes   moist. Oropharynx clear. Good dentition. Neck: Supple. No lymphadenopathy. No thyromegaly. Lungs: Clear to auscultation bilaterally. No wheezing, rales or rhonchi. Psych: Alert and  oriented. Normal mood and affect.  Health Maintenance Due  Topic Date Due   INFLUENZA VACCINE  03/11/2022   Lab Results POCT COVID- Neg.    Assessment & Plan:   1. Viral URI with cough Discussed home care for viral illness, including rest, pushing fluids, and OTC medications as needed for symptom relief. Recommend hot tea with honey for sore throat symptoms. Follow-up if needed for worsening or persistent symptoms.  - POC COVID-19  2. HSV-1 (herpes simplex virus 1) infection Discussed use of antiviral to reduce the length and severity of HSV-1 outbreaks. Should start within 5 days of rash being present, so too late for this episode.  - valACYclovir (VALTREX) 1000 MG tablet; Take 1 tablet (1,000 mg total) by mouth 2 (two) times daily for 5 days. Use as needed for outbreaks of cold  sores.  Dispense: 10 tablet; Refill: 3   Return if symptoms worsen or fail to improve.   Loyola Mast, MD

## 2022-05-21 ENCOUNTER — Telehealth: Payer: Self-pay | Admitting: Nurse Practitioner

## 2022-05-21 ENCOUNTER — Ambulatory Visit: Payer: Medicaid Other | Admitting: Nurse Practitioner

## 2022-05-21 NOTE — Telephone Encounter (Signed)
Pt cancelled (with after hours line) 1 hour before OV with Lauren. This is her first, letter has been sent.

## 2022-05-28 NOTE — Telephone Encounter (Signed)
Noted  

## 2022-05-28 NOTE — Telephone Encounter (Signed)
1st no show, fee waived, letter sent 

## 2022-07-17 ENCOUNTER — Ambulatory Visit: Payer: Medicaid Other | Admitting: Nurse Practitioner

## 2022-07-17 ENCOUNTER — Encounter: Payer: Self-pay | Admitting: Nurse Practitioner

## 2022-07-17 VITALS — BP 116/74 | HR 75 | Temp 98.2°F | Resp 16 | Ht 63.0 in | Wt 164.1 lb

## 2022-07-17 DIAGNOSIS — L210 Seborrhea capitis: Secondary | ICD-10-CM | POA: Diagnosis not present

## 2022-07-17 DIAGNOSIS — L853 Xerosis cutis: Secondary | ICD-10-CM

## 2022-07-17 MED ORDER — KETOCONAZOLE 2 % EX SHAM: 1.0000 | MEDICATED_SHAMPOO | CUTANEOUS | 0 refills | Status: DC

## 2022-07-17 MED ORDER — LORATADINE 10 MG PO TABS: 10.0000 mg | ORAL_TABLET | Freq: Every day | ORAL | 3 refills | Status: DC

## 2022-07-17 NOTE — Progress Notes (Signed)
   Acute Office Visit  Subjective:     Patient ID: Wanda Norton, female    DOB: [birth year], 36 y.o.   MRN: [id number]  Chief Complaint  Patient presents with   Pruritis    hx of generalized itching, moved to mainly face and scalp    HPI Patient is in today for itching of her face and scalp. She was having generalized skin itching and was started on loratadine 10mg  daily and she was told to try Selsun blue for her dandruff.  She states that the loratadine helped with the itching of her legs, arms, stomach and back, however she is still having itching of her face and scalp.  She states the [facility] did not help her dandruff at all.  She was having some intermittent rashes to her face.  She denies fevers.  ROS See pertinent positives and negatives per HPI.     Objective:    BP 116/74   Pulse 75   Temp 98.2 F (36.8 C) (Oral)   Resp 16   Ht 5\' 3"  (1.6 m)   Wt 164 lb 2 oz (74.4 kg)   SpO2 98%   Breastfeeding No   BMI 29.07 kg/m    Physical Exam Vitals and nursing note reviewed.  Constitutional:      General: She is not in acute distress.    Appearance: Normal appearance.  HENT:     Head: Normocephalic.  Eyes:     Conjunctiva/sclera: Conjunctivae normal.  Pulmonary:     Effort: Pulmonary effort is normal.  Musculoskeletal:     Cervical back: Normal range of motion.  Skin:    General: Skin is warm.     Comments: Dandruff to scalp  Neurological:     General: No focal deficit present.     Mental Status: She is alert and oriented to person, place, and time.  Psychiatric:        Mood and Affect: Mood normal.        Behavior: Behavior normal.        Thought Content: Thought content normal.        Judgment: Judgment normal.       Assessment & Plan:   Problem List Items Addressed This Visit       Musculoskeletal and Integument   Dry skin - Primary    Ongoing dry skin and itching to her face and scalp.  Will have her continue the loratadine 10 mg daily.   Encouraged her to start using lotion on her face daily, such as Cetaphil.  Referral placed to dermatology.      Relevant Orders   Ambulatory referral to Dermatology   Other Visit Diagnoses     Dandruff       Selsun Blue did not help, will trial ketoconazole shampoo twice a week.  Referral placed to dermatology.   Relevant Orders   Ambulatory referral to Dermatology       Meds ordered this encounter  Medications   ketoconazole (NIZORAL) 2 % shampoo    Sig: Apply 1 Application topically 2 (two) times a week.    Dispense:  120 mL    Refill:  0   loratadine (CLARITIN) 10 MG tablet    Sig: Take 1 tablet (10 mg total) by mouth daily.    Dispense:  90 tablet    Refill:  3    No follow-ups on file.  [facility], NP

## 2022-07-17 NOTE — Patient Instructions (Signed)
It was great to see you!  Start using ketoconazole shampoo twice a week.  I have placed a referral to dermatology.   You can keep taking the loratadine daily.   Let's follow-up if your symptoms worsen or don't improve.   Take care,  Rodman Pickle, NP

## 2022-07-17 NOTE — Assessment & Plan Note (Signed)
Ongoing dry skin and itching to her face and scalp.  Will have her continue the loratadine 10 mg daily.  Encouraged her to start using lotion on her face daily, such as Cetaphil.  Referral placed to dermatology.

## 2022-08-28 ENCOUNTER — Telehealth: Payer: Self-pay | Admitting: Nurse Practitioner

## 2022-08-28 NOTE — Telephone Encounter (Signed)
I left a message for the patient to return my call.

## 2022-08-28 NOTE — Telephone Encounter (Signed)
Pt said skin/scalp still itching . Can you please call the pt

## 2022-08-29 MED ORDER — KETOCONAZOLE 2 % EX SHAM: 1.0000 | MEDICATED_SHAMPOO | CUTANEOUS | 0 refills | Status: DC

## 2022-08-29 NOTE — Telephone Encounter (Signed)
I left a message for the patient to return my call.

## 2022-08-29 NOTE — Telephone Encounter (Signed)
Spoke with patient  was referred to dermatology in Dec . However  the office that she was sent to doesn't take MCD. She wanted to know if she could have another referral to an office that take her insurance. Also she wants to know if she can have another refill  for shampoo.

## 2022-08-29 NOTE — Telephone Encounter (Signed)
Pt returning call. Pt at [phone] [facility])

## 2022-08-29 NOTE — Telephone Encounter (Signed)
Sent to [facility]. Dr. Daine Floras He is the only one that acppets Medicaid

## 2022-09-15 DIAGNOSIS — E663 Overweight: Secondary | ICD-10-CM | POA: Diagnosis not present

## 2022-09-15 DIAGNOSIS — Z130 Encounter for screening for diseases of the blood and blood-forming organs and certain disorders involving the immune mechanism: Secondary | ICD-10-CM | POA: Diagnosis not present

## 2022-09-15 DIAGNOSIS — Z131 Encounter for screening for diabetes mellitus: Secondary | ICD-10-CM | POA: Diagnosis not present

## 2022-09-15 DIAGNOSIS — E559 Vitamin D deficiency, unspecified: Secondary | ICD-10-CM | POA: Diagnosis not present

## 2022-09-15 DIAGNOSIS — N951 Menopausal and female climacteric states: Secondary | ICD-10-CM | POA: Diagnosis not present

## 2022-09-15 DIAGNOSIS — Z13220 Encounter for screening for lipoid disorders: Secondary | ICD-10-CM | POA: Diagnosis not present

## 2022-09-15 DIAGNOSIS — Z1329 Encounter for screening for other suspected endocrine disorder: Secondary | ICD-10-CM | POA: Diagnosis not present

## 2022-09-21 ENCOUNTER — Other Ambulatory Visit: Payer: Self-pay

## 2022-09-21 ENCOUNTER — Encounter (HOSPITAL_COMMUNITY): Payer: Self-pay

## 2022-09-21 ENCOUNTER — Emergency Department (HOSPITAL_COMMUNITY)
Admission: EM | Admit: 2022-09-21 | Discharge: 2022-09-21 | Disposition: A | Discharge status: Home / Self Care | Payer: Medicaid Other | Attending: Emergency Medicine | Admitting: Emergency Medicine

## 2022-09-21 DIAGNOSIS — Z1152 Encounter for screening for COVID-19: Secondary | ICD-10-CM | POA: Insufficient documentation

## 2022-09-21 DIAGNOSIS — N644 Mastodynia: Secondary | ICD-10-CM | POA: Insufficient documentation

## 2022-09-21 DIAGNOSIS — R509 Fever, unspecified: Secondary | ICD-10-CM | POA: Diagnosis present

## 2022-09-21 DIAGNOSIS — J111 Influenza due to unidentified influenza virus with other respiratory manifestations: Secondary | ICD-10-CM | POA: Insufficient documentation

## 2022-09-21 LAB — RESP PANEL BY RT-PCR (RSV, FLU A&B, COVID)  RVPGX2
Influenza A by PCR: NEGATIVE
Influenza B by PCR: POSITIVE — AB
Resp Syncytial Virus by PCR: NEGATIVE
SARS Coronavirus 2 by RT PCR: NEGATIVE

## 2022-09-21 MED ORDER — BENZONATATE 100 MG PO CAPS: 100.0000 mg | ORAL_CAPSULE | Freq: Three times a day (TID) | ORAL | 0 refills | Status: DC

## 2022-09-21 NOTE — ED Provider Notes (Signed)
[place] Provider Note   CSN: [id number] Arrival date & time: 09/21/22  [place]     History Chief Complaint  Patient presents with   Fever   Cough    Wanda Norton is a 37 y.o. female  otherwise healthy presents to the ER for evaluation of fever Tmax subjective, cough, runny nose, nasal congestion, cough, and body aches for the past 5 days. Denies any chest pain, SOB, abdominal pain, nausea, vomiting, neck stiffness, or dysuria. She has been rotating Tylenol and Motrin for symptoms. Multiple family members with similar symptoms. Denies any medical history ot daily medications. NKDA. Denies any tobacco, EtOH ,or illicit drug use.    Fever Associated symptoms: congestion, cough, myalgias and rhinorrhea   Associated symptoms: no chest pain, no chills, no diarrhea, no dysuria, no ear pain, no nausea, no rash, no sore throat and no vomiting   Cough Associated symptoms: fever, myalgias and rhinorrhea   Associated symptoms: no chest pain, no chills, no ear pain, no eye discharge, no rash, no shortness of breath and no sore throat        Home Medications Prior to Admission medications   Medication Sig Start Date End Date Taking? Authorizing Provider  acetaminophen (TYLENOL) 325 MG tablet Take 2 tablets (650 mg total) by mouth every 4 (four) hours as needed (for pain scale < 4). 01/03/21   Mee Hives, MD  benzonatate (TESSALON) 100 MG capsule Take 1 capsule (100 mg total) by mouth every 8 (eight) hours. 09/21/22   Sherrell Puller, PA-C  etonogestrel (NEXPLANON) 68 MG IMPL implant 1 each by Subdermal route once.    [provider]  ketoconazole (NIZORAL) 2 % shampoo Apply 1 Application topically 2 (two) times a week. 09/01/22   McElwee, Lauren A, NP  loratadine (CLARITIN) 10 MG tablet Take 1 tablet (10 mg total) by mouth daily. 07/17/22   McElwee, Scheryl Darter, NP  Prenatal Vit-Fe Fumarate-FA (PRENATAL MULTIVITAMIN) TABS tablet Take 1  tablet by mouth at bedtime. Patient not taking: Reported on 07/17/2022    [provider]      Allergies    Patient has no known allergies.    Review of Systems   Review of Systems  Constitutional:  Positive for fever. Negative for chills.  HENT:  Positive for congestion and rhinorrhea. Negative for drooling, ear pain, sore throat and trouble swallowing.   Eyes:  Negative for photophobia, discharge and visual disturbance.  Respiratory:  Positive for cough. Negative for shortness of breath.   Cardiovascular:  Negative for chest pain and palpitations.  Gastrointestinal:  Negative for abdominal pain, diarrhea, nausea and vomiting.  Genitourinary:  Negative for dysuria and hematuria.  Musculoskeletal:  Positive for myalgias. Negative for arthralgias, back pain and joint swelling.  Skin:  Negative for color change and rash.  Neurological:  Negative for syncope and weakness.    Physical Exam Updated Vital Signs BP 114/75   Pulse (!) 102   Temp 98.8 F (37.1 C) (Oral)   Resp 17   Ht 5' 3"$  (1.6 m)   Wt 74.4 kg   SpO2 100%   BMI 29.05 kg/m  Physical Exam Vitals and nursing note reviewed.  Constitutional:      General: She is not in acute distress.    Appearance: She is not ill-appearing or toxic-appearing.  HENT:     Head: Normocephalic and atraumatic.     Right Ear: Tympanic membrane, ear canal and external ear normal.  Left Ear: Tympanic membrane, ear canal and external ear normal.     Nose:     Comments: Bilateral nasal turbinate edema and erythema with scant clear nasal discharge.    Mouth/Throat:     Mouth: Mucous membranes are moist.     Comments: No pharyngeal erythema, exudate, or edema noted.  Uvula midline.  Airway patent.  Moist mucous membranes. Eyes:     General: No scleral icterus.    Conjunctiva/sclera: Conjunctivae normal.  Cardiovascular:     Rate and Rhythm: Normal rate.  Pulmonary:     Effort: Pulmonary effort is normal. No respiratory  distress.     Comments: Some right breast tenderness to the outer lower quadrant. No fluctuance, induration, erythema, or rash noted. No nipple inversion. No nipple discharge noted. No swelling noted. No mass palpated. Fibrocystic changes palpated.  Abdominal:     General: Abdomen is flat.     Palpations: Abdomen is soft.  Musculoskeletal:        General: No deformity.     Cervical back: Normal range of motion.  Skin:    Findings: No rash.  Neurological:     General: No focal deficit present.     Mental Status: She is alert.     ED Results / Procedures / Treatments   Labs (all labs ordered are listed, but only abnormal results are displayed) Labs Reviewed  RESP PANEL BY RT-PCR (RSV, FLU A&B, COVID)  RVPGX2 - Abnormal; Notable for the following components:      Result Value   Influenza B by PCR POSITIVE (*)    All other components within normal limits    EKG None  Radiology No results found.  Procedures Procedures   Medications Ordered in ED Medications - No data to display  ED Course/ Medical Decision Making/ A&P                           Medical Decision Making Risk Prescription drug management.   37 y/o F presents to the ER for evaluation of FLS for the past 5 days. Differential diagnosis includes but is not limited to RSV, COVID, flu, viral illness, bronchitis, pneumonia. Vital sign signs show mild tachycardia. She is afebrile and satting 100% on RA. Physical exam as noted above.   The patient is positive for the flu. Multiple family members at home also positive for the flu. Unfortunately, the patient is out of the window for Tamiflu given that it has been 5 days. Recommended she continue to rotate Tylenol/Motrin as needed. The patient is tolerating PO and has not had any vomiting at home. I do not think the patient needs a CXR as her lung sounds are clear and she does not have any increase work of breathing.   Discussed the patient that supportive care measures.  Will prescribe her some tessalon pearles for her cough. Encouraged staying well hydrated. Her breast pain may be menstrual related. I do not see any nipple discharge, and the pt denies any previously. NO palpable abscess or cyst. No overlying erythema, rash, fluctuance, or induration. Recommended follow up with her PCP for this. We discussed strict return precautions and red flag symptoms. The patient verbalizes her understanding and agrees to the plan.    Final Clinical Impression(s) / ED Diagnoses Final diagnoses:  Flu  Breast pain, right    Rx / DC Orders ED Discharge Orders          Ordered  benzonatate (TESSALON) 100 MG capsule  Every 8 hours,   Status:  Discontinued        09/21/22 2121    benzonatate (TESSALON) 100 MG capsule  Every 8 hours        09/21/22 2122              Sherrell Puller, PA-C 09/24/22 0017    Sherwood Gambler, MD 09/26/22 [phone]

## 2022-09-21 NOTE — ED Triage Notes (Signed)
States that since wednesday she has been coughing, with fever, and headache

## 2022-09-21 NOTE — Discharge Instructions (Addendum)
You were seen in the ER today for evaluation of your cough and cold symptoms.  He has a positive for flu.  Given that this is your fifth day of symptoms, I cannot offer you any antiviral medications.  I have sent you in a prescription for some Tessalon Perles which help with your cough.  Please continue rotating your Motrin and Tylenol to help with your pains.  You can also try spoonful of honey as well.  I would follow-up with your primary care doctor for evaluation of your breast pain.  If you have any concerns, new or worsening symptoms, please turn to the nearest emergency room for evaluation.  Contact a doctor if: You get new symptoms. You have: Chest pain. Watery poop (diarrhea). A fever. Your cough gets worse. You start to have more mucus. You feel sick to your stomach. You throw up. Get help right away if you: Have shortness of breath. Have trouble breathing. Have skin or nails that turn a bluish color. Have very bad pain or stiffness in your neck. Get a sudden headache. Get sudden pain in your face or ear. Cannot eat or drink without throwing up. These symptoms may represent a serious problem that is an emergency. Get medical help right away. Call your local emergency services (911 in the [place] Do not wait to see if the symptoms will go away. Do not drive yourself to the hospital.

## 2022-09-22 ENCOUNTER — Ambulatory Visit: Payer: Medicaid Other | Admitting: Nurse Practitioner

## 2022-09-22 ENCOUNTER — Encounter: Payer: Self-pay | Admitting: Nurse Practitioner

## 2022-09-22 VITALS — BP 110/68 | HR 71 | Temp 97.8°F | Ht 63.0 in | Wt 164.2 lb

## 2022-09-22 DIAGNOSIS — N644 Mastodynia: Secondary | ICD-10-CM | POA: Diagnosis not present

## 2022-09-22 NOTE — Patient Instructions (Addendum)
It was great to see you!  I have placed an order for screening mammogram, they should call you to schedule. If you do not hear from them in the next week, please call:  [facility] [place], [place], [place]  You can take tylenol or ibuprofen as needed for pain. You can also do warm or cool compresses.   You can take delsym cough syrup, or dayquil or nyquil to help with the flu.   Let's follow-up if your symptoms worsen or don't improve.   Take care,  Vance Peper, NP

## 2022-09-22 NOTE — Progress Notes (Signed)
   Acute Office Visit  Subjective:     Patient ID: Wanda Norton, female    DOB: [birth year], 37 y.o.   MRN: [id number]  Chief Complaint  Patient presents with   Acute Visit    Right breast pain for 5-6 days    HPI Patient is in today for right breast pain for the past 5 to 6 days.  She states that she has not had this pain before.  She has not felt any lumps, see any changes in skin, or have nipple discharge.  She was recently diagnosed with the flu and has been having some fevers due to this.  Her left breast is not painful.  She denies family history of breast cancer.  ROS See pertinent positives and negatives per HPI.     Objective:    BP 110/68 (BP Location: Left Arm)   Pulse 71   Temp 97.8 F (36.6 C) (Temporal)   Ht 5\' 3"  (1.6 m)   Wt 164 lb 3.2 oz (74.5 kg)   LMP 09/18/2022   SpO2 99%   BMI 29.09 kg/m    Physical Exam Vitals and nursing note reviewed.  Constitutional:      General: She is not in acute distress.    Appearance: Normal appearance.  HENT:     Head: Normocephalic.  Eyes:     Conjunctiva/sclera: Conjunctivae normal.  Pulmonary:     Effort: Pulmonary effort is normal.  Chest:     Chest wall: No mass.  Breasts:    Right: Normal.     Left: Normal.  Musculoskeletal:     Cervical back: Normal range of motion.  Lymphadenopathy:     Upper Body:     Right upper body: No supraclavicular, axillary or pectoral adenopathy.     Left upper body: No supraclavicular, axillary or pectoral adenopathy.  Skin:    General: Skin is warm.  Neurological:     General: No focal deficit present.     Mental Status: She is alert and oriented to person, place, and time.  Psychiatric:        Mood and Affect: Mood normal.        Behavior: Behavior normal.        Thought Content: Thought content normal.        Judgment: Judgment normal.      Assessment & Plan:   Problem List Items Addressed This Visit   None Visit Diagnoses     Breast pain, right    -  Primary    Breast exam normal.  Will check diagnostic mammo and ultrasound.  She can take ibuprofen or Tylenol and use cold or warm compresses prn pain.   Relevant Orders   MM DIAG BREAST TOMO BILATERAL   US BREAST LTD UNI RIGHT INC AXILLA       No orders of the defined types were placed in this encounter.   Return if symptoms worsen or fail to improve.  Charyl Dancer, NP

## 2022-09-24 NOTE — ED Provider Notes (Incomplete)
[name] [facility] AT [facility] Provider Note   CSN: [id number] Arrival date & time: 09/21/22  1835     History {Add pertinent medical, surgical, social history, OB history to HPI:1} Chief Complaint  Patient presents with  . Fever  . Cough    Wanda Norton is a 37 y.o. female.   Fever Associated symptoms: cough   Cough Associated symptoms: fever        Home Medications Prior to Admission medications   Medication Sig Start Date End Date Taking? Authorizing Provider  acetaminophen (TYLENOL) 325 MG tablet Take 2 tablets (650 mg total) by mouth every 4 (four) hours as needed (for pain scale < 4). 01/03/21   Mee Hives, MD  benzonatate (TESSALON) 100 MG capsule Take 1 capsule (100 mg total) by mouth every 8 (eight) hours. 09/21/22   Sherrell Puller, PA-C  etonogestrel (NEXPLANON) 68 MG IMPL implant 1 each by Subdermal route once.    [provider]  ketoconazole (NIZORAL) 2 % shampoo Apply 1 Application topically 2 (two) times a week. 09/01/22   McElwee, Lauren A, NP  loratadine (CLARITIN) 10 MG tablet Take 1 tablet (10 mg total) by mouth daily. 07/17/22   McElwee, Scheryl Darter, NP  Prenatal Vit-Fe Fumarate-FA (PRENATAL MULTIVITAMIN) TABS tablet Take 1 tablet by mouth at bedtime. Patient not taking: Reported on 07/17/2022    [provider]      Allergies    Patient has no known allergies.    Review of Systems   Review of Systems  Constitutional:  Positive for fever.  Respiratory:  Positive for cough.     Physical Exam Updated Vital Signs BP 114/75   Pulse (!) 102   Temp 98.8 F (37.1 C) (Oral)   Resp 17   Ht 5' 3"$  (1.6 m)   Wt 74.4 kg   SpO2 100%   BMI 29.05 kg/m  Physical Exam  ED Results / Procedures / Treatments   Labs (all labs ordered are listed, but only abnormal results are displayed) Labs Reviewed  RESP PANEL BY RT-PCR (RSV, FLU A&B, COVID)  RVPGX2 - Abnormal; Notable for the following components:       Result Value   Influenza B by PCR POSITIVE (*)    All other components within normal limits    EKG None  Radiology No results found.  Procedures Procedures   Medications Ordered in ED Medications - No data to display  ED Course/ Medical Decision Making/ A&P                           Medical Decision Making Risk Prescription drug management.   37 y/o F presents to the ER for evaluation of FLS for the past 5 days. Differential diagnosis includes but is not limited to RSV, COVID, flu, viral illness, bronchitis, pneumonia. Vital sign signs show mild tachycardia. She is afebrile and satting 100% on RA. Physical exam as noted above.   The patient is positive for the flu. Multiple family members at home also positive for the flu. Unfortunately, the patient is out of the window for Tamiflu given that it has been 5 days. Recommended she continue to rotate Tylenol/Motrin as needed. The patient is tolerating PO and has not had any vomiting at home. I do not think the patient needs a CXR as her lung sounds are clear and she does not have any increase work of breathing.   Discussed the patient that  supportive care measures. Will prescribe her some tessalon pearles for her cough. Encouraged staying well hydrated. Her breast pain may be menstrual related. I do not see any nipple discharge, and the pt denies any previously. NO palpable abscess or cyst. No overlying erythema, rash, fluctuance, or induration. Recommended follow up with her PCP for this. We discussed strict return precautions and red flag symptoms. The patient verbalizes her understanding and agrees to the plan.    Final Clinical Impression(s) / ED Diagnoses Final diagnoses:  Flu  Breast pain, right    Rx / DC Orders ED Discharge Orders          Ordered    benzonatate (TESSALON) 100 MG capsule  Every 8 hours,   Status:  Discontinued        09/21/22 2121    benzonatate (TESSALON) 100 MG capsule  Every 8 hours        09/21/22  2122

## 2022-09-30 DIAGNOSIS — L299 Pruritus, unspecified: Secondary | ICD-10-CM | POA: Diagnosis not present

## 2022-10-07 ENCOUNTER — Encounter: Payer: Self-pay | Admitting: Family Medicine

## 2022-10-07 ENCOUNTER — Ambulatory Visit: Payer: Medicaid Other | Admitting: Family Medicine

## 2022-10-07 VITALS — BP 124/82 | HR 84 | Temp 98.0°F | Ht 63.0 in | Wt 167.4 lb

## 2022-10-07 DIAGNOSIS — J40 Bronchitis, not specified as acute or chronic: Secondary | ICD-10-CM | POA: Diagnosis not present

## 2022-10-07 MED ORDER — HYDROCODONE BIT-HOMATROP MBR 5-1.5 MG/5ML PO SOLN: 5.0000 mL | Freq: Three times a day (TID) | ORAL | 0 refills | Status: DC | PRN

## 2022-10-07 NOTE — Progress Notes (Signed)
[facility] [address] [place] [address] Dept: [phone] Dept Fax: [phone]  Office Visit  Subjective:    Patient ID: Wanda Norton, female    DOB: 20-Feb-1986, 37 y.o..   MRN: [id number]  Chief Complaint  Patient presents with   Cough    C/o still having lingering cough, loss of taste/smell after having Flu on 09/21/22.      History of Present Illness:  Patient is in today with a persistent cough. She notes she was seen on 09/21/2022  at [facility] and diagnosed with influenza. She was prescribed Tessalon. She notes that she is still having issues with a cough and with loss of taste/smell. This morning, she had an episode of sharp lower mid chest pain that lasted for about 15 min.   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   Dry skin 12/31/2021   Post term pregnancy over 40 weeks 12/31/2020   Pregnancy 09/23/2016   Encounter for insertion of mirena IUD 10/08/2011   Contraception management 08/07/2011   Past Surgical History:  Procedure Laterality Date   NO PAST SURGERIES     Family History  Problem Relation Age of Onset   Alcohol abuse Neg Hx    Arthritis Neg Hx    Asthma Neg Hx    Birth defects Neg Hx    Cancer Neg Hx    COPD Neg Hx    Depression Neg Hx    Diabetes Neg Hx    Drug abuse Neg Hx    Early death Neg Hx    Hearing loss Neg Hx    Heart disease Neg Hx    Hyperlipidemia Neg Hx    Hypertension Neg Hx    Kidney disease Neg Hx    Learning disabilities Neg Hx    Mental illness Neg Hx    Mental retardation Neg Hx    Miscarriages / Stillbirths Neg Hx    Stroke Neg Hx    Vision loss Neg Hx    Varicose Veins Neg Hx    Outpatient Medications Prior to Visit  Medication Sig Dispense Refill   acetaminophen (TYLENOL) 325 MG tablet Take 2 tablets (650 mg total) by mouth every 4 (four) hours as needed (for pain scale  < 4).     etonogestrel (NEXPLANON) 68 MG IMPL implant 1 each by Subdermal route once.     ketoconazole (NIZORAL) 2 % shampoo Apply 1 Application topically 2 (two) times a week. 120 mL 0   benzonatate (TESSALON) 100 MG capsule Take 1 capsule (100 mg total) by mouth every 8 (eight) hours. 10 capsule 0   No facility-administered medications prior to visit.   No Known Allergies   Objective:   Today's Vitals   10/07/22 1609  BP: 124/82  Pulse: 84  Temp: 98 F (36.7 C)  TempSrc: Temporal  SpO2: 98%  Weight: 167 lb 6.4 oz (75.9 kg)  Height: '5\' 3"'$  (1.6 m)   Body mass index is 29.65 kg/m.   General: Well developed, well nourished. No acute distress. HEENT: Normocephalic, non-traumatic. PERRL, EOMI. Conjunctiva clear. External ears   normal. EAC and TMs normal bilaterally. Nose clear without congestion or rhinorrhea.   Mucous membranes moist. Oropharynx clear. Good dentition. Neck: Supple. No lymphadenopathy. No thyromegaly. Lungs: Clear to auscultation bilaterally. No wheezing, rales or rhonchi. CV: RRR without murmurs or rubs. Pulses 2+ bilaterally. Psych: Alert and oriented. Normal  mood and affect.  There are no preventive care reminders to display for this patient.    Assessment & Plan:   Problem List Items Addressed This Visit       Respiratory   Bronchitis - Primary    Discussed that cough due to bronchitis can last 2-4 weeks. Reviewed home care for viral illness, including rest, pushing fluids, and OTC medications as needed for symptom relief. I will prescribed some cough syrup.  Follow-up if needed for worsening or persistent symptoms.       Relevant Medications   HYDROcodone bit-homatropine (HYCODAN) 5-1.5 MG/5ML syrup    Return if symptoms worsen or fail to improve.   Haydee Salter, MD

## 2022-10-07 NOTE — Assessment & Plan Note (Signed)
Discussed that cough due to bronchitis can last 2-4 weeks. Reviewed home care for viral illness, including rest, pushing fluids, and OTC medications as needed for symptom relief. I will prescribed some cough syrup.  Follow-up if needed for worsening or persistent symptoms.

## 2022-10-09 DIAGNOSIS — R5382 Chronic fatigue, unspecified: Secondary | ICD-10-CM | POA: Diagnosis not present

## 2022-10-09 DIAGNOSIS — E559 Vitamin D deficiency, unspecified: Secondary | ICD-10-CM | POA: Diagnosis not present

## 2022-10-09 DIAGNOSIS — R454 Irritability and anger: Secondary | ICD-10-CM | POA: Diagnosis not present

## 2022-10-09 DIAGNOSIS — F32A Depression, unspecified: Secondary | ICD-10-CM | POA: Diagnosis not present

## 2022-10-09 DIAGNOSIS — N951 Menopausal and female climacteric states: Secondary | ICD-10-CM | POA: Diagnosis not present

## 2022-10-09 DIAGNOSIS — Z1331 Encounter for screening for depression: Secondary | ICD-10-CM | POA: Diagnosis not present

## 2022-10-09 DIAGNOSIS — Z6828 Body mass index (BMI) 28.0-28.9, adult: Secondary | ICD-10-CM | POA: Diagnosis not present

## 2022-10-09 DIAGNOSIS — F419 Anxiety disorder, unspecified: Secondary | ICD-10-CM | POA: Diagnosis not present

## 2022-10-09 DIAGNOSIS — R232 Flushing: Secondary | ICD-10-CM | POA: Diagnosis not present

## 2022-10-09 DIAGNOSIS — E782 Mixed hyperlipidemia: Secondary | ICD-10-CM | POA: Diagnosis not present

## 2022-10-09 DIAGNOSIS — Z1339 Encounter for screening examination for other mental health and behavioral disorders: Secondary | ICD-10-CM | POA: Diagnosis not present

## 2022-10-09 DIAGNOSIS — M2559 Pain in other specified joint: Secondary | ICD-10-CM | POA: Diagnosis not present

## 2022-10-10 ENCOUNTER — Telehealth: Payer: Self-pay | Admitting: Nurse Practitioner

## 2022-10-10 NOTE — Telephone Encounter (Signed)
I spoke with patient and Ander Purpura has advised her to follow up in 3 months and she would like to follow up with Ander Purpura, Appointment scheduled.

## 2022-10-10 NOTE — Telephone Encounter (Signed)
I spoke with patient and she was seen at [facility] in [place] and her Vitamin D was 13. She said that she was told to take an OTC vitamin D.

## 2022-10-10 NOTE — Telephone Encounter (Signed)
Pt would like to know her vitamin D results.

## 2022-10-15 ENCOUNTER — Telehealth: Payer: Self-pay | Admitting: Nurse Practitioner

## 2022-10-15 NOTE — Telephone Encounter (Signed)
Misti [phone]   Pt stated she has not been able to sleep and would like a medication to help with this.

## 2022-10-15 NOTE — Telephone Encounter (Signed)
I will call her.

## 2022-10-20 ENCOUNTER — Ambulatory Visit: Payer: Medicaid Other | Admitting: Nurse Practitioner

## 2022-10-20 ENCOUNTER — Encounter: Payer: Self-pay | Admitting: Nurse Practitioner

## 2022-10-20 VITALS — BP 110/68 | HR 78 | Temp 98.7°F | Ht 63.0 in | Wt 165.0 lb

## 2022-10-20 DIAGNOSIS — E559 Vitamin D deficiency, unspecified: Secondary | ICD-10-CM | POA: Insufficient documentation

## 2022-10-20 DIAGNOSIS — G4700 Insomnia, unspecified: Secondary | ICD-10-CM | POA: Insufficient documentation

## 2022-10-20 DIAGNOSIS — Z6828 Body mass index (BMI) 28.0-28.9, adult: Secondary | ICD-10-CM | POA: Diagnosis not present

## 2022-10-20 DIAGNOSIS — L299 Pruritus, unspecified: Secondary | ICD-10-CM | POA: Diagnosis not present

## 2022-10-20 DIAGNOSIS — E782 Mixed hyperlipidemia: Secondary | ICD-10-CM | POA: Diagnosis not present

## 2022-10-20 DIAGNOSIS — R430 Anosmia: Secondary | ICD-10-CM | POA: Insufficient documentation

## 2022-10-20 NOTE — Progress Notes (Signed)
Acute Office Visit  Subjective:     Patient ID: Wanda Norton, female    DOB: October 10, 1985, 37 y.o.   MRN: [id number]  Chief Complaint  Patient presents with   Insomnia    Wants to get a sleep medication, unable to smell since February 2024    HPI Patient is in today for trouble sleeping, unable to smell, and itchy skin.  She states that she has been having trouble sleeping for the last 3 weeks.  She states that nothing has changed in her diet or routine.  She states that she used to be able to drink coffee and go to sleep right afterwards, however now that is not happening.  She has been drinking chamomile tea which has helped a little bit with her sleep.  She will sometimes go on her phone before bed.  She does not exercise regularly, but she has 4 children who keep her active.  She states that she has been able to smell and has an altered sense of taste since having flu at the beginning of February.  She was unable to smell when her daughter needed a diaper change and she said that her coffee smells burnt.  She denies sore throat, trouble swallowing.  She states that the loratadine is helping with her itchy skin, however she is interested in knowing what she is allergic to.  She would like a referral to allergy.  ROS See pertinent positives and negatives per HPI.     Objective:    BP 110/68 (BP Location: Right Arm)   Pulse 78   Temp 98.7 F (37.1 C)   Ht '5\' 3"'$  (1.6 m)   Wt 165 lb (74.8 kg)   LMP 09/18/2022 (Approximate)   SpO2 98%   BMI 29.23 kg/m    Physical Exam Vitals and nursing note reviewed.  Constitutional:      General: She is not in acute distress.    Appearance: Normal appearance.  HENT:     Head: Normocephalic.  Eyes:     Conjunctiva/sclera: Conjunctivae normal.  Cardiovascular:     Rate and Rhythm: Normal rate and regular rhythm.     Pulses: Normal pulses.     Heart sounds: Normal heart sounds.  Pulmonary:     Effort: Pulmonary effort is normal.      Breath sounds: Normal breath sounds.  Musculoskeletal:     Cervical back: Normal range of motion and neck supple. No tenderness.  Lymphadenopathy:     Cervical: No cervical adenopathy.  Skin:    General: Skin is warm.  Neurological:     General: No focal deficit present.     Mental Status: She is alert and oriented to person, place, and time.  Psychiatric:        Mood and Affect: Mood normal.        Behavior: Behavior normal.        Thought Content: Thought content normal.        Judgment: Judgment normal.      Assessment & Plan:   Problem List Items Addressed This Visit       Musculoskeletal and Integument   Itchy skin - Primary    Chronic, stable.  She states that the loratadine is helping with her itchy skin.  She would like to know what she is allergic to.  Referral placed to allergy for testing.      Relevant Orders   Ambulatory referral to Allergy     Other   Loss  of smell    She has noticed a loss of smell and altered sense of taste since having the flu at the beginning of February 1 month ago.  Discussed doing nose retraining with expelling the scent that she knows once to twice a day.  Discussed that the altered sense of taste is most likely from the loss of smell.  Follow-up if symptoms worsen or do not improve.      Insomnia    She has been having some trouble sleeping for the last 3 weeks.  Discussed sleep hygiene and information given as well.  She can continue using the chamomile tea and can also try melatonin at bedtime.  Follow-up if symptoms worsen or do not improve.      Vitamin D deficiency    She states that she had her vitamin D tested at [facility] and it was 13.  She is currently taking a supplement.  Will recheck vitamin D levels in 3 months.       No orders of the defined types were placed in this encounter.   Return in about 3 months (around 01/20/2023).  Charyl Dancer, NP

## 2022-10-20 NOTE — Patient Instructions (Signed)
It was great to see you!  Try chamomile tea to help with sleep. You can also try melatonin supplement over the counter  You can take the allergy pill as needed. I have placed a referral to allergy  Start smelling a food or scent that you really know the smell of every day to retrain your nose and smell.   Let's follow-up if your symptoms worsen or any concerns.   If a referral was placed today, you will be contacted for an appointment. Please note that routine referrals can sometimes take up to 3-4 weeks to process. Please call our office if you haven't heard anything after this time frame.  Take care,  Vance Peper, NP

## 2022-10-20 NOTE — Assessment & Plan Note (Signed)
She has been having some trouble sleeping for the last 3 weeks.  Discussed sleep hygiene and information given as well.  She can continue using the chamomile tea and can also try melatonin at bedtime.  Follow-up if symptoms worsen or do not improve.

## 2022-10-20 NOTE — Assessment & Plan Note (Signed)
She states that she had her vitamin D tested at [facility] and it was 13.  She is currently taking a supplement.  Will recheck vitamin D levels in 3 months.

## 2022-10-20 NOTE — Assessment & Plan Note (Signed)
Chronic, stable.  She states that the loratadine is helping with her itchy skin.  She would like to know what she is allergic to.  Referral placed to allergy for testing.

## 2022-10-20 NOTE — Assessment & Plan Note (Signed)
She has noticed a loss of smell and altered sense of taste since having the flu at the beginning of February 1 month ago.  Discussed doing nose retraining with expelling the scent that she knows once to twice a day.  Discussed that the altered sense of taste is most likely from the loss of smell.  Follow-up if symptoms worsen or do not improve.

## 2022-10-27 ENCOUNTER — Other Ambulatory Visit: Payer: Medicaid Other

## 2022-12-17 ENCOUNTER — Encounter: Payer: Self-pay | Admitting: Allergy

## 2022-12-17 ENCOUNTER — Ambulatory Visit: Payer: Medicaid Other | Admitting: Allergy

## 2022-12-17 VITALS — BP 100/60 | HR 71 | Temp 98.3°F | Resp 18 | Ht 63.1 in | Wt 162.2 lb

## 2022-12-17 DIAGNOSIS — L299 Pruritus, unspecified: Secondary | ICD-10-CM

## 2022-12-17 DIAGNOSIS — L508 Other urticaria: Secondary | ICD-10-CM

## 2022-12-17 MED ORDER — FAMOTIDINE 20 MG PO TABS: 20.0000 mg | ORAL_TABLET | Freq: Every day | ORAL | 5 refills | Status: DC

## 2022-12-17 MED ORDER — LORATADINE 10 MG PO TABS: 10.0000 mg | ORAL_TABLET | Freq: Every day | ORAL | 5 refills | Status: DC

## 2022-12-17 NOTE — Progress Notes (Signed)
New Patient Note  RE: Wanda Norton MRN: [id number] DOB: 02-Mar-1986 Date of Office Visit: 12/17/2022  Primary care provider: Gerre Scull, NP  Chief Complaint: itching  History of present illness: Wanda Norton is a 37 y.o. female presenting today for evaluation of rash and itching.   She reports having an itchy rash for about a year.  She provided pictures of the rash that appear to be urticaria but more fine bumps suggestive of cholinergic urticaria.  The rash can come and go weekly.  The rash has been on her face, neck, arms, abdomen.  She feels like the rash started after she dyed her hair about a year ago.  She states if she touches hair she can have the rash.   She states the rash can occur randomly however and recalls recently she was just driving in her care when the rash popped up.  No joint aches/pains, fever or swelling.  Not leaving any marks/bruising behind.  Denies temperature changes, foods, pressure as triggers. Denies any preceding illnesses.  No concern for bites or stings.  She did change her hair dye after this started.  She has had birth control implant placed some time after the rash had started and it is still in place for 3 years. Otherwise no new medications. She has used benadryl cream which doesn't work. She reports taking Claritin which does seem to help.  She started initially taking daily but  currently using it as needed.  Last episode of rash was 2 days ago.  The rash has resolved but is still itchy.    No history of food allergy, eczema, asthma or allergic rhinoconjunctivitis.   Review of systems: Review of Systems  Constitutional: Negative.   HENT: Negative.    Eyes: Negative.   Respiratory: Negative.    Cardiovascular: Negative.   Gastrointestinal: Negative.   Musculoskeletal: Negative.   Skin:        See HPI  Allergic/Immunologic: Negative.   Neurological: Negative.     All other systems negative unless noted above in HPI  Past medical  history: Past Medical History:  Diagnosis Date   Allergy    Headache    Vitamin D deficiency     Past surgical history: Past Surgical History:  Procedure Laterality Date   NO PAST SURGERIES      Family history:  Family History  Problem Relation Age of Onset   Alcohol abuse Neg Hx    Arthritis Neg Hx    Asthma Neg Hx    Birth defects Neg Hx    Cancer Neg Hx    COPD Neg Hx    Depression Neg Hx    Diabetes Neg Hx    Drug abuse Neg Hx    Early death Neg Hx    Hearing loss Neg Hx    Heart disease Neg Hx    Hyperlipidemia Neg Hx    Hypertension Neg Hx    Kidney disease Neg Hx    Learning disabilities Neg Hx    Mental illness Neg Hx    Mental retardation Neg Hx    Miscarriages / Stillbirths Neg Hx    Stroke Neg Hx    Vision loss Neg Hx    Varicose Veins Neg Hx    Allergic rhinitis Neg Hx    Eczema Neg Hx    Urticaria Neg Hx     Social history: Lives in a home with out carpeting with gas heating and central cooling.  Dog in the  home.  There is no concern for water damage, mildew or roaches in the home.  She works in Furniture conservator/restorer.  She denies a smoking history.   Medication List: Current Outpatient Medications  Medication Sig Dispense Refill   acetaminophen (TYLENOL) 325 MG tablet Take 2 tablets (650 mg total) by mouth every 4 (four) hours as needed (for pain scale < 4).     etonogestrel (NEXPLANON) 68 MG IMPL implant 1 each by Subdermal route once.     UNABLE TO FIND Med Name: Vitamin D3 and K2     No current facility-administered medications for this visit.    Known medication allergies: No Known Allergies   Physical examination: Blood pressure 100/60, pulse 71, temperature 98.3 F (36.8 C), temperature source Temporal, resp. rate 18, height 5' 3.1" (1.603 m), weight 162 lb 3.2 oz (73.6 kg), SpO2 97 %, not currently breastfeeding.  General: Alert, interactive, in no acute distress. HEENT: PERRLA, TMs pearly gray, turbinates non-edematous without discharge,  post-pharynx non erythematous. Neck: Supple without lymphadenopathy. Lungs: Clear to auscultation without wheezing, rhonchi or rales. {no increased work of breathing. CV: Normal S1, S2 without murmurs. Abdomen: Nondistended, nontender. Skin: Warm and dry, without lesions or rashes. Extremities:  No clubbing, cyanosis or edema. Neuro:   Grossly intact.  Diagnositics/Labs: None today   Assessment and plan:   Chronic urticaria Pruritus  - at this time etiology of hives and swelling is unknown.  Hives can be caused by a variety of different triggers including illness/infection, foods, medications, stings, exercise, pressure, vibrations, extremes of temperature to name a few however majority of the time there is no identifiable trigger.  Your symptoms have been ongoing for >6 weeks making this chronic thus will obtain labwork to evaluate: CBC w diff, CMP, tryptase, hive panel, environmental panel, alpha-gal panel  -  for hive control recommend primary antihistamine, Claritin 1 tab daily WITH secondary antihistamine, Pepcid 1 tab daily at this time.   - if daily dosing is not effective enough in controlling hives then will step-up therapy  Follow-up in 2-3 months or sooner if needed  I appreciate the opportunity to take part in Thedora's care. Please do not hesitate to contact me with questions.  Sincerely,   Margo Aye, MD Allergy/Immunology [facility]

## 2022-12-17 NOTE — Patient Instructions (Addendum)
Chronic hives  - at this time etiology of hives and swelling is unknown.  Hives can be caused by a variety of different triggers including illness/infection, foods, medications, stings, exercise, pressure, vibrations, extremes of temperature to name a few however majority of the time there is no identifiable trigger.  Your symptoms have been ongoing for >6 weeks making this chronic thus will obtain labwork to evaluate: CBC w diff, CMP, tryptase, hive panel, environmental panel, alpha-gal panel  -  for hive control recommend primary antihistamine, Claritin 1 tab daily WITH secondary antihistamine, Pepcid 1 tab daily at this time.   - if daily dosing is not effective enough in controlling hives then will step-up therapy  Follow-up in 2-3 months or sooner if needed

## 2022-12-23 LAB — ALLERGENS W/TOTAL IGE AREA 2
Alternaria Alternata IgE: <0.1 kU/L
Aspergillus Fumigatus IgE: <0.1 kU/L
Bermuda Grass IgE: <0.1 kU/L
Cat Dander IgE: <0.1 kU/L
Cedar, Mountain IgE: <0.1 kU/L
Cladosporium Herbarum IgE: <0.1 kU/L
Cockroach, German IgE: 1.54 kU/L — AB
Common Silver Birch IgE: <0.1 kU/L
Cottonwood IgE: <0.1 kU/L
D Farinae IgE: 4.2 kU/L — AB
D Pteronyssinus IgE: 1.56 kU/L — AB
Dog Dander IgE: <0.1 kU/L
Elm, American IgE: <0.1 kU/L
Johnson Grass IgE: <0.1 kU/L
Maple/Box Elder IgE: 0.11 kU/L — AB
Mouse Urine IgE: <0.1 kU/L
Oak, White IgE: <0.1 kU/L
Pecan, Hickory IgE: <0.1 kU/L
Penicillium Chrysogen IgE: <0.1 kU/L
Pigweed, Rough IgE: <0.1 kU/L
Ragweed, Short IgE: <0.1 kU/L
Sheep Sorrel IgE Qn: <0.1 kU/L
Timothy Grass IgE: <0.1 kU/L
White Mulberry IgE: <0.1 kU/L

## 2022-12-23 LAB — COMPREHENSIVE METABOLIC PANEL
ALT: 9 IU/L (ref 0–32)
AST: 16 IU/L (ref 0–40)
Albumin/Globulin Ratio: 1.6 (ref 1.2–2.2)
Albumin: 4.2 g/dL (ref 3.9–4.9)
Alkaline Phosphatase: 56 IU/L (ref 44–121)
BUN/Creatinine Ratio: 23 (ref 9–23)
BUN: 11 mg/dL (ref 6–20)
Bilirubin Total: 0.2 mg/dL (ref 0.0–1.2)
CO2: 23 mmol/L (ref 20–29)
Calcium: 9.4 mg/dL (ref 8.7–10.2)
Chloride: 103 mmol/L (ref 96–106)
Creatinine, Ser: 0.48 mg/dL — ABNORMAL LOW (ref 0.57–1.00)
Globulin, Total: 2.7 g/dL (ref 1.5–4.5)
Glucose: 112 mg/dL — ABNORMAL HIGH (ref 70–99)
Potassium: 4.3 mmol/L (ref 3.5–5.2)
Sodium: 141 mmol/L (ref 134–144)
Total Protein: 6.9 g/dL (ref 6.0–8.5)
eGFR: 125 mL/min/{1.73_m2} (ref >59)

## 2022-12-23 LAB — CBC WITH DIFFERENTIAL
Basophils Absolute: 0 10*3/uL (ref 0.0–0.2)
Basos: 1 %
EOS (ABSOLUTE): 0.1 10*3/uL (ref 0.0–0.4)
Eos: 2 %
Hematocrit: 40.2 % (ref 34.0–46.6)
Hemoglobin: 13.1 g/dL (ref 11.1–15.9)
Immature Grans (Abs): 0 10*3/uL (ref 0.0–0.1)
Immature Granulocytes: 0 %
Lymphocytes Absolute: 1.7 10*3/uL (ref 0.7–3.1)
Lymphs: 39 %
MCH: 29 pg (ref 26.6–33.0)
MCHC: 32.6 g/dL (ref 31.5–35.7)
MCV: 89 fL (ref 79–97)
Monocytes Absolute: 0.2 10*3/uL (ref 0.1–0.9)
Monocytes: 5 %
Neutrophils Absolute: 2.3 10*3/uL (ref 1.4–7.0)
Neutrophils: 53 %
RBC: 4.51 x10E6/uL (ref 3.77–5.28)
RDW: 13.8 % (ref 11.7–15.4)
WBC: 4.4 10*3/uL (ref 3.4–10.8)

## 2022-12-23 LAB — THYROID ANTIBODIES
Thyroglobulin Antibody: <1 IU/mL (ref 0.0–0.9)
Thyroperoxidase Ab SerPl-aCnc: 11 IU/mL (ref 0–34)

## 2022-12-23 LAB — TSH: TSH: 0.652 u[IU]/mL (ref 0.450–4.500)

## 2022-12-23 LAB — ALPHA-GAL PANEL
Allergen Lamb IgE: <0.1 kU/L
Beef IgE: <0.1 kU/L
IgE (Immunoglobulin E), Serum: 126 IU/mL (ref 6–495)
O215-IgE Alpha-Gal: <0.1 kU/L
Pork IgE: <0.1 kU/L

## 2022-12-23 LAB — CHRONIC URTICARIA: cu index: 14.6 — ABNORMAL HIGH (ref <10)

## 2022-12-23 LAB — TRYPTASE: Tryptase: 5.7 ug/L (ref 2.2–13.2)

## 2022-12-30 ENCOUNTER — Telehealth: Payer: Self-pay | Admitting: Nurse Practitioner

## 2022-12-30 ENCOUNTER — Telehealth: Payer: Self-pay | Admitting: Allergy

## 2022-12-30 DIAGNOSIS — E7800 Pure hypercholesterolemia, unspecified: Secondary | ICD-10-CM

## 2022-12-30 DIAGNOSIS — E559 Vitamin D deficiency, unspecified: Secondary | ICD-10-CM

## 2022-12-30 DIAGNOSIS — R7303 Prediabetes: Secondary | ICD-10-CM

## 2022-12-30 NOTE — Telephone Encounter (Signed)
[place] [phone]   The pt wants to have labs before her appt on the 31st. I stated that there was not an order and someone would call if she can make or wait until her appt.

## 2022-12-30 NOTE — Addendum Note (Signed)
Addended by: Rodman Pickle A on: 12/30/2022 10:36 PM   Modules accepted: Orders

## 2022-12-30 NOTE — Telephone Encounter (Signed)
Patient called to get lab results.

## 2022-12-30 NOTE — Telephone Encounter (Signed)
Called and reviewed the labs with the patient. Patient verbalized understanding and asked about how long she needs to take the antihistamines. I advised that per Dr. Randell Patient note to take the antihistamines every day until her follow up appointment. Patient verbalized understanding, she has been scheduled to for a follow up appointment to see Dr. Delorse Lek in August.

## 2022-12-31 NOTE — Telephone Encounter (Signed)
I called and spoke with patient and an a lab visit made for 01-07-23

## 2023-01-07 ENCOUNTER — Other Ambulatory Visit (INDEPENDENT_AMBULATORY_CARE_PROVIDER_SITE_OTHER): Payer: Medicaid Other

## 2023-01-07 DIAGNOSIS — R7303 Prediabetes: Secondary | ICD-10-CM

## 2023-01-07 DIAGNOSIS — E559 Vitamin D deficiency, unspecified: Secondary | ICD-10-CM

## 2023-01-07 DIAGNOSIS — E7800 Pure hypercholesterolemia, unspecified: Secondary | ICD-10-CM | POA: Diagnosis not present

## 2023-01-07 LAB — VITAMIN D 25 HYDROXY (VIT D DEFICIENCY, FRACTURES): VITD: 54.58 ng/mL (ref 30.00–100.00)

## 2023-01-07 LAB — HEMOGLOBIN A1C: Hgb A1c MFr Bld: 5.6 % (ref 4.6–6.5)

## 2023-01-07 LAB — LIPID PANEL
Cholesterol: 159 mg/dL (ref 0–200)
HDL: 40.8 mg/dL (ref >39.00)
LDL Cholesterol: 95 mg/dL (ref 0–99)
NonHDL: 117.79
Total CHOL/HDL Ratio: 4
Triglycerides: 114 mg/dL (ref 0.0–149.0)
VLDL: 22.8 mg/dL (ref 0.0–40.0)

## 2023-01-09 ENCOUNTER — Ambulatory Visit: Payer: Medicaid Other | Admitting: Nurse Practitioner

## 2023-01-09 ENCOUNTER — Encounter: Payer: Self-pay | Admitting: Nurse Practitioner

## 2023-01-09 VITALS — BP 118/80 | HR 70 | Temp 97.3°F | Ht 63.0 in | Wt 164.0 lb

## 2023-01-09 DIAGNOSIS — E559 Vitamin D deficiency, unspecified: Secondary | ICD-10-CM

## 2023-01-09 DIAGNOSIS — L299 Pruritus, unspecified: Secondary | ICD-10-CM

## 2023-01-09 DIAGNOSIS — R7303 Prediabetes: Secondary | ICD-10-CM

## 2023-01-09 DIAGNOSIS — E7800 Pure hypercholesterolemia, unspecified: Secondary | ICD-10-CM | POA: Diagnosis not present

## 2023-01-09 NOTE — Assessment & Plan Note (Signed)
Chronic, stable.  Vitamin D has improved from 13-50.  Continue vitamin D supplement daily.

## 2023-01-09 NOTE — Assessment & Plan Note (Signed)
Chronic, stable.  Most recent A1c was 5.6% is back into normal range.  Congratulated her on her nutrition changes.

## 2023-01-09 NOTE — Patient Instructions (Signed)
It was great to see you!  Keep taking loratadine and famotidine 1 tablet daily. This will help with your allergies and the hoarseness.   Keep up the great work!   Let's follow-up in 6 months, sooner if you have concerns.  If a referral was placed today, you will be contacted for an appointment. Please note that routine referrals can sometimes take up to 3-4 weeks to process. Please call our office if you haven't heard anything after this time frame.  Take care,  Rodman Pickle, NP

## 2023-01-09 NOTE — Assessment & Plan Note (Signed)
She met with an allergist earlier this month and was started on loratadine 10 mg daily and Pepcid 20 mg daily.  Discussed the reasoning behind Pepcid and she is going to start this daily.  Discussed that this can also help with her postnasal drip and hoarseness.  Follow-up in 6 months or sooner with concerns.

## 2023-01-09 NOTE — Progress Notes (Signed)
Established Patient Office Visit  Subjective   Patient ID: Wanda Norton, female    DOB: 03/18/1986  Age: 37 y.o. MRN: [id number]  Chief Complaint  Patient presents with   Hoarse    In the mornings, go over recent lab work, discuss medication    HPI  Wanda Norton is here to follow-up on hyperlipidemia, prediabetes, vitamin D deficiency, and itchy skin.   She states that she has been watching her nutrition and due to her religion she was fasting for 2 months from February to April.  She has not been exercising.  She denies chest pain and shortness of breath.  She has been taking her vitamin D supplement daily.  She went and saw the allergist and had allergy testing done.  She has started taking loratadine and was prescribed pepcid, however she didn't start this as she saw it was for acid reflux and stomach ulcers. She has been experiencing post nasal drip and hoarseness in the mornings that has worsened over the last week. She denies chest pain and shortness of breath.     ROS See pertinent positives and negatives per HPI.    Objective:     BP 118/80 (BP Location: Right Arm)   Pulse 70   Temp (!) 97.3 F (36.3 C)   Ht 5\' 3"  (1.6 m)   Wt 164 lb (74.4 kg)   LMP 12/30/2022 (Approximate)   SpO2 97%   BMI 29.05 kg/m  BP Readings from Last 3 Encounters:  01/09/23 118/80  12/17/22 100/60  10/20/22 110/68   Wt Readings from Last 3 Encounters:  01/09/23 164 lb (74.4 kg)  12/17/22 162 lb 3.2 oz (73.6 kg)  10/20/22 165 lb (74.8 kg)      Physical Exam Vitals and nursing note reviewed.  Constitutional:      General: She is not in acute distress.    Appearance: Normal appearance.  HENT:     Head: Normocephalic.  Eyes:     Conjunctiva/sclera: Conjunctivae normal.  Cardiovascular:     Rate and Rhythm: Normal rate and regular rhythm.     Pulses: Normal pulses.     Heart sounds: Normal heart sounds.  Pulmonary:     Effort: Pulmonary effort is normal.     Breath sounds:  Normal breath sounds.  Musculoskeletal:     Cervical back: Normal range of motion.  Skin:    General: Skin is warm.  Neurological:     General: No focal deficit present.     Mental Status: She is alert and oriented to person, place, and time.  Psychiatric:        Mood and Affect: Mood normal.        Behavior: Behavior normal.        Thought Content: Thought content normal.        Judgment: Judgment normal.      Assessment & Plan:   Problem List Items Addressed This Visit       Musculoskeletal and Integument   Itchy skin    She met with an allergist earlier this month and was started on loratadine 10 mg daily and Pepcid 20 mg daily.  Discussed the reasoning behind Pepcid and she is going to start this daily.  Discussed that this can also help with her postnasal drip and hoarseness.  Follow-up in 6 months or sooner with concerns.        Other   Vitamin D deficiency    Chronic, stable.  Vitamin D has improved  from 13-50.  Continue vitamin D supplement daily.      Prediabetes - Primary    Chronic, stable.  Most recent A1c was 5.6% is back into normal range.  Congratulated her on her nutrition changes.      Pure hypercholesterolemia    Chronic, stable.  Lipid panel has improved since changing her diet.  Continue with nutritional changes and slowly increase exercise as able.       Return in about 6 months (around 07/11/2023) for CPE.    Gerre Scull, NP

## 2023-01-09 NOTE — Assessment & Plan Note (Signed)
Chronic, stable.  Lipid panel has improved since changing her diet.  Continue with nutritional changes and slowly increase exercise as able.

## 2023-03-09 ENCOUNTER — Other Ambulatory Visit: Payer: Self-pay

## 2023-03-09 ENCOUNTER — Emergency Department (HOSPITAL_COMMUNITY)
Admission: EM | Admit: 2023-03-09 | Discharge: 2023-03-09 | Disposition: A | Discharge status: Home / Self Care | Payer: Medicaid Other | Attending: Emergency Medicine | Admitting: Emergency Medicine

## 2023-03-09 ENCOUNTER — Encounter (HOSPITAL_COMMUNITY): Payer: Self-pay

## 2023-03-09 DIAGNOSIS — M62830 Muscle spasm of back: Secondary | ICD-10-CM | POA: Diagnosis not present

## 2023-03-09 DIAGNOSIS — M549 Dorsalgia, unspecified: Secondary | ICD-10-CM | POA: Diagnosis present

## 2023-03-09 MED ORDER — DIAZEPAM 5 MG/ML IJ SOLN
2.5000 mg | Freq: Once | INTRAMUSCULAR | Status: AC
  Administered 2023-03-09: 2.5 mg via INTRAMUSCULAR
  Filled 2023-03-09: qty 2

## 2023-03-09 MED ORDER — OXYCODONE-ACETAMINOPHEN 5-325 MG PO TABS
2.0000 | ORAL_TABLET | Freq: Once | ORAL | Status: AC
  Administered 2023-03-09: 2 via ORAL
  Filled 2023-03-09: qty 2

## 2023-03-09 MED ORDER — LIDOCAINE 5 % EX PTCH: 1.0000 | MEDICATED_PATCH | CUTANEOUS | 0 refills | Status: DC

## 2023-03-09 MED ORDER — KETOROLAC TROMETHAMINE 60 MG/2ML IM SOLN
60.0000 mg | Freq: Once | INTRAMUSCULAR | Status: AC
  Administered 2023-03-09: 60 mg via INTRAMUSCULAR
  Filled 2023-03-09: qty 2

## 2023-03-09 MED ORDER — LIDOCAINE 5 % EX PTCH
1.0000 | MEDICATED_PATCH | CUTANEOUS | Status: DC
  Administered 2023-03-09: 1 via TRANSDERMAL
  Filled 2023-03-09: qty 1

## 2023-03-09 MED ORDER — METHOCARBAMOL 500 MG PO TABS: 500.0000 mg | ORAL_TABLET | Freq: Two times a day (BID) | ORAL | 0 refills | Status: DC

## 2023-03-09 NOTE — Discharge Instructions (Addendum)
Take the prescribed medication as directed.  Can use heating pad, warm soaks, etc to help with muscle tension. Follow-up with your primary care doctor. Return to the ED for new or worsening symptoms.

## 2023-03-09 NOTE — ED Provider Notes (Signed)
[facility] AT [facility] Provider Note   CSN: [id number] Arrival date & time: 03/09/23  0411     History  Chief Complaint  Patient presents with   Back Pain    Wanda Norton is a 37 y.o. female.  The history is provided by the patient and medical records.  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  States she was bending over washing her daughter in the bath and changing her close when she tried to stand up she felt her back "locked up" and has had a lot of pain since that time.  States her back feels incredibly tight and stiff.  She denies any numbness or weakness of the legs.  No bowel or bladder incontinence.  She did try taking some Aleve at home without any relief.  She has never had issues like this before.  No prior back surgeries.  No fever or chills.  Home Medications Prior to Admission medications   Medication Sig Start Date End Date Taking? Authorizing Provider  acetaminophen (TYLENOL) 325 MG tablet Take 2 tablets (650 mg total) by mouth every 4 (four) hours as needed (for pain scale < 4). 01/03/21   Rosalio Loud, MD  etonogestrel (NEXPLANON) 68 MG IMPL implant 1 each by Subdermal route once. Placed in left arm    [provider]  famotidine (PEPCID) 20 MG tablet Take 1 tablet (20 mg total) by mouth daily. Patient not taking: Reported on 01/09/2023 12/17/22   Marcelyn Bruins, MD  loratadine (CLARITIN) 10 MG tablet Take 1 tablet (10 mg total) by mouth daily. 12/17/22   Marcelyn Bruins, MD  UNABLE TO FIND Med Name: Vitamin D3 and K2    [provider]      Allergies    Patient has no known allergies.    Review of Systems   Review of Systems  Musculoskeletal:  Positive for back pain.  All other systems reviewed and are negative.   Physical Exam Updated Vital Signs BP 111/75   Pulse 83   Temp 98.5 F (36.9 C) (Oral)    Resp 16   SpO2 99%   Physical Exam Vitals and nursing note reviewed.  Constitutional:      Appearance: She is well-developed.  HENT:     Head: Normocephalic and atraumatic.  Eyes:     Conjunctiva/sclera: Conjunctivae normal.     Pupils: Pupils are equal, round, and reactive to light.  Cardiovascular:     Rate and Rhythm: Normal rate and regular rhythm.     Heart sounds: Normal heart sounds.  Pulmonary:     Effort: Pulmonary effort is normal.     Breath sounds: Normal breath sounds.  Abdominal:     General: Bowel sounds are normal.     Palpations: Abdomen is soft.  Musculoskeletal:        General: Normal range of motion.     Cervical back: Normal range of motion.     Comments: Fairly significant spasm noted along lower thoracic and lumbar paraspinal musculature, no apparent midline deformity or step-off, normal strength/sensation of both legs  Skin:    General: Skin is warm and dry.  Neurological:     Mental Status: She is alert and oriented to person, place, and time.     ED Results / Procedures / Treatments   Labs (all labs ordered are listed, but only abnormal results are displayed) Labs  Reviewed - No data to display  EKG None  Radiology No results found.  Procedures Procedures    Medications Ordered in ED Medications  oxyCODONE-acetaminophen (PERCOCET/ROXICET) 5-325 MG per tablet 2 tablet (has no administration in time range)  lidocaine (LIDODERM) 5 % 1 patch (has no administration in time range)  ketorolac (TORADOL) injection 60 mg (60 mg Intramuscular Given 03/09/23 0521)  diazepam (VALIUM) injection 2.5 mg (2.5 mg Intramuscular Given 03/09/23 0519)    ED Course/ Medical Decision Making/ A&P                             Medical Decision Making Risk Prescription drug management.   37 year old female here with back pain after bathing her daughter and trying get her dressed this morning.  States upon standing felt like her "back locked up".  She is  afebrile and nontoxic in appearance here.  She does have noted spasm of the lower thoracic and lumbar paraspinal musculature.  There is no apparent midline step-off or deformity.  She has no focal neurologic deficits.  No red flag symptoms.  I suspect this is likely muscular.  She did not have any fall or direct trauma to the back so do not feel emergent imaging is indicated at this time.  She was given Toradol and Valium.  5:59 AM Patient states still having quite a bit of back pain and "cannot move" but she appears more comfortable than when I initially saw her and has changed positioning on stretcher.  Will give additional percocet and apply Lidoderm patch.  Will plan to d/c home with continued symptomatic care.  She will need to follow-up with PCP.  Return here for new concerns.  Final Clinical Impression(s) / ED Diagnoses Final diagnoses:  Muscle spasm of back    Rx / DC Orders ED Discharge Orders          Ordered    methocarbamol (ROBAXIN) 500 MG tablet  2 times daily        03/09/23 0602    lidocaine (LIDODERM) 5 %  Every 24 hours        03/09/23 0602              Garlon Hatchet, PA-C 03/09/23 [id number]    Glynn Octave, MD 03/09/23 [phone]

## 2023-03-09 NOTE — ED Triage Notes (Signed)
Pt. Arrives POV for back pain. Pt. States that she was bending over helping someone put clothes on. When she tried to stand up she felt her back lock up and has not been able to do a lot of moving due to the pain.

## 2023-03-10 ENCOUNTER — Ambulatory Visit: Payer: Medicaid Other | Admitting: Internal Medicine

## 2023-03-10 ENCOUNTER — Encounter: Payer: Self-pay | Admitting: Internal Medicine

## 2023-03-10 VITALS — BP 120/74 | HR 69 | Temp 98.2°F | Ht 63.0 in | Wt 168.2 lb

## 2023-03-10 DIAGNOSIS — S39012A Strain of muscle, fascia and tendon of lower back, initial encounter: Secondary | ICD-10-CM | POA: Diagnosis not present

## 2023-03-10 MED ORDER — IBUPROFEN 800 MG PO TABS: 800.0000 mg | ORAL_TABLET | Freq: Three times a day (TID) | ORAL | 0 refills | Status: DC | PRN

## 2023-03-10 NOTE — Patient Instructions (Signed)
Take Robaxin (methocarbamol) and Ibuprofen as prescribed.  Use heating pad  Biofreeze patches or roll on gel - apply to lower back as needed for pain Rest, no heavy lifting.

## 2023-03-10 NOTE — Progress Notes (Signed)
[facility] [address] [place] [address] Dept: [phone] Dept Fax: [phone]  Acute Care Office Visit  Subjective:   Wanda Norton 1985-12-12 03/10/2023  Chief Complaint  Patient presents with   Hospitalization Follow-up    Back pain    HPI: Discussed the use of AI scribe software for clinical note transcription with the patient, who gave verbal consent to proceed.  History of Present Illness   The patient, a mother of four, presents with severe lower back pain that began 1 day ago after she was bending over to bathe her youngest child, and then immediately stood up.The pain was so severe that she was unable to move her legs or even breathe without discomfort. She describes the pain as 'locking up' her lower back. She has a history of back pain, but this episode was significantly worse than her usual discomfort.  She sought emergency care, where she received 2 tablets of Percocet, Toradol 60 mg IM, Valium 2.5 mg IM, and a lidocaine patch. She was discharged with a prescription for lidocaine patches and Robaxin (a muscle relaxant). However, she was unable to afford the lidocaine patches and did not pick them up. Despite taking the Robaxin, she reports that the pain is still present, though slightly improved. She denies any numbness radiating down her legs or any loss of bowel or bladder control.       The following portions of the patient's history were reviewed and updated as appropriate: past medical history, past surgical history, family history, social history, allergies, medications, and problem list.   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   Dry  skin 12/31/2021   Post term pregnancy over 40 weeks 12/31/2020   Pregnancy 09/23/2016   Encounter for insertion of mirena IUD 10/08/2011   Contraception management 08/07/2011   Past Medical History:  Diagnosis Date   Allergy    Headache    Vitamin D deficiency    Past Surgical History:  Procedure Laterality Date   NO PAST SURGERIES     Family History  Problem Relation Age of Onset   Alcohol abuse Neg Hx    Arthritis Neg Hx    Asthma Neg Hx    Birth defects Neg Hx    Cancer Neg Hx    COPD Neg Hx    Depression Neg Hx    Diabetes Neg Hx    Drug abuse Neg Hx    Early death Neg Hx    Hearing loss Neg Hx    Heart disease Neg Hx    Hyperlipidemia Neg Hx    Hypertension Neg Hx    Kidney disease Neg Hx    Learning disabilities Neg Hx    Mental illness Neg Hx    Mental retardation Neg Hx    Miscarriages / Stillbirths Neg Hx    Stroke Neg Hx    Vision loss Neg Hx    Varicose Veins Neg Hx    Allergic rhinitis Neg Hx    Eczema Neg Hx    Urticaria Neg Hx    Outpatient Medications Prior to Visit  Medication Sig Dispense Refill   acetaminophen (TYLENOL) 325 MG tablet Take 2 tablets (650 mg total) by mouth  every 4 (four) hours as needed (for pain scale < 4).     etonogestrel (NEXPLANON) 68 MG IMPL implant 1 each by Subdermal route once. Placed in left arm     famotidine (PEPCID) 20 MG tablet Take 1 tablet (20 mg total) by mouth daily. 30 tablet 5   loratadine (CLARITIN) 10 MG tablet Take 1 tablet (10 mg total) by mouth daily. 30 tablet 5   methocarbamol (ROBAXIN) 500 MG tablet Take 1 tablet (500 mg total) by mouth 2 (two) times daily. 20 tablet 0   lidocaine (LIDODERM) 5 % Place 1 patch onto the skin daily. Remove & Discard patch within 12 hours or as directed by MD (Patient not taking: Reported on 03/10/2023) 12 patch 0   UNABLE TO FIND Med Name: Vitamin D3 and K2     No facility-administered medications prior to visit.   No Known Allergies   ROS: A complete ROS was  performed with pertinent positives/negatives noted in the HPI. The remainder of the ROS are negative.    Objective:   Today's Vitals   03/10/23 1446  BP: 120/74  Pulse: 69  Temp: 98.2 F (36.8 C)  TempSrc: Temporal  SpO2: 98%  Weight: 168 lb 3.2 oz (76.3 kg)  Height: 5\' 3"  (1.6 m)    GENERAL: Well-appearing, in NAD. Well nourished.  SKIN: Pink, warm and dry. No rash, lesion, ulceration, or ecchymoses.  NECK: Trachea midline. Full ROM w/o pain or tenderness. No lymphadenopathy.  RESPIRATORY: Chest wall symmetrical. Respirations even and non-labored. Breath sounds clear to auscultation bilaterally.  CARDIAC: S1, S2 present, regular rate and rhythm. Peripheral pulses 2+ bilaterally.  MSK: Muscle tone and strength appropriate for age.  Pain with movement to diffuse lower back.  No pain with palpation.  No palpable deformities. EXTREMITIES: Without clubbing, cyanosis, or edema.  NEUROLOGIC: No motor or sensory deficits. Steady, even gait.  PSYCH/MENTAL STATUS: Alert, oriented x 3. Cooperative, appropriate mood and affect.    No results found for any visits on 03/10/23.    Assessment & Plan:  Assessment and Plan    Low Back strain: Severe pain after bending over, no radicular symptoms or bowel/bladder incontinence. Likely muscle strain. -Continue Robaxin as prescribed by ER. -Prescribe Ibuprofen, to be taken after meals. -Recommend use of heat pad intermittently during the day. -Advise patient to avoid heavy lifting and use proper body mechanics. -Consider over-the-counter Biofreeze patches for additional pain relief. -Expect gradual improvement over the next week to ten days.       Meds ordered this encounter  Medications   ibuprofen (ADVIL) 800 MG tablet    Sig: Take 1 tablet (800 mg total) by mouth every 8 (eight) hours as needed (pain). Take with food.    Dispense:  30 tablet    Refill:  0    Order Specific Question:   Supervising Provider    Answer:   Garnette Gunner  [[id number]]   Lab Orders  No laboratory test(s) ordered today   No images are attached to the encounter or orders placed in the encounter.  Return if symptoms worsen or fail to improve.   Of note, portions of this note may have been created with voice recognition software Physicist, medical). While this note has been edited for accuracy, occasional wrong-word or 'sound-a-like' substitutions may have occurred due to the inherent limitations of voice recognition software.  Salvatore Decent, FNP

## 2023-04-02 ENCOUNTER — Ambulatory Visit: Payer: Medicaid Other | Admitting: Allergy

## 2023-04-06 ENCOUNTER — Encounter: Payer: Self-pay | Admitting: Nurse Practitioner

## 2023-04-06 ENCOUNTER — Ambulatory Visit: Payer: Medicaid Other | Admitting: Nurse Practitioner

## 2023-04-06 VITALS — BP 102/60 | HR 75 | Temp 97.7°F | Ht 63.0 in | Wt 165.0 lb

## 2023-04-06 DIAGNOSIS — N3001 Acute cystitis with hematuria: Secondary | ICD-10-CM | POA: Diagnosis not present

## 2023-04-06 DIAGNOSIS — R309 Painful micturition, unspecified: Secondary | ICD-10-CM | POA: Diagnosis not present

## 2023-04-06 LAB — POCT URINALYSIS DIPSTICK
Bilirubin, UA: NEGATIVE
Blood, UA: POSITIVE
Glucose, UA: NEGATIVE
Ketones, UA: NEGATIVE
Nitrite, UA: NEGATIVE
Protein, UA: POSITIVE — AB
Spec Grav, UA: >=1.03 — AB (ref 1.010–1.025)
Urobilinogen, UA: 0.2 E.U./dL
pH, UA: 5.5 (ref 5.0–8.0)

## 2023-04-06 MED ORDER — NITROFURANTOIN MONOHYD MACRO 100 MG PO CAPS: 100.0000 mg | ORAL_CAPSULE | Freq: Two times a day (BID) | ORAL | 0 refills | Status: DC

## 2023-04-06 NOTE — Patient Instructions (Addendum)
It was great to see you!  Start macrobid twice a day for 5 days for your urinary infection   Drink plenty of fluids.   Glucosamine or glucosamine-chondroitin is a good vitamin for joints.   Let's follow-up if your symptoms worsen or don't improve.   Take care,  Rodman Pickle, NP

## 2023-04-06 NOTE — Progress Notes (Signed)
Acute Office Visit  Subjective:     Patient ID: Wanda Norton, female    DOB: Aug 03, 1986, 37 y.o.   MRN: [id number]  Chief Complaint  Patient presents with   Burning with Urination    Since last night-taken Amoxicillin    HPI Patient is in today for dysuria that started yesterday.   URINARY SYMPTOMS  Dysuria: burning Urinary frequency: yes Urgency: yes Small volume voids: yes Symptom severity:  moderate Urinary incontinence: no Foul odor: no Hematuria: no Abdominal pain: no Back pain: no Suprapubic pain/pressure: yes Flank pain: no Fever:  no Vomiting: no Relief with cranberry juice:  n/a Relief with pyridium:  n/a Status: stable Previous urinary tract infection: yes Recurrent urinary tract infection: no Treatments attempted: amoxicillin, ibuprofen, apple cider vinegar   ROS See pertinent positives and negatives per HPI.     Objective:    BP 102/60 (BP Location: Left Arm)   Pulse 75   Temp 97.7 F (36.5 C)   Ht 5\' 3"  (1.6 m)   Wt 165 lb (74.8 kg)   LMP 03/30/2023 (Exact Date)   SpO2 95%   BMI 29.23 kg/m    Physical Exam Vitals and nursing note reviewed.  Constitutional:      General: She is not in acute distress.    Appearance: Normal appearance.  HENT:     Head: Normocephalic.  Eyes:     Conjunctiva/sclera: Conjunctivae normal.  Pulmonary:     Effort: Pulmonary effort is normal.  Musculoskeletal:     Cervical back: Normal range of motion.  Skin:    General: Skin is warm.  Neurological:     General: No focal deficit present.     Mental Status: She is alert and oriented to person, place, and time.  Psychiatric:        Mood and Affect: Mood normal.        Behavior: Behavior normal.        Thought Content: Thought content normal.        Judgment: Judgment normal.     Results for orders placed or performed in visit on 04/06/23  POCT Urinalysis Dipstick  Result Value Ref Range   Color, UA     Clarity, UA     Glucose, UA Negative  Negative   Bilirubin, UA Negative    Ketones, UA Negative    Spec Grav, UA >=1.030 (A) 1.010 - 1.025   Blood, UA Positive    pH, UA 5.5 5.0 - 8.0   Protein, UA Positive (A) Negative   Urobilinogen, UA 0.2 0.2 or 1.0 E.U./dL   Nitrite, UA Negative    Leukocytes, UA Moderate (2+) (A) Negative   Appearance     Odor          Assessment & Plan:   Problem List Items Addressed This Visit   None Visit Diagnoses     Acute cystitis with hematuria    -  Primary   Will treat with macrobid BID x5 days. Encourage fluids, can drink cranberry juice. Will culture urine. F/U if not improving   Relevant Orders   Urine Culture   Pain with urination       U/A positive for UTI, see plan above   Relevant Orders   POCT Urinalysis Dipstick (Completed)       Meds ordered this encounter  Medications   nitrofurantoin, macrocrystal-monohydrate, (MACROBID) 100 MG capsule    Sig: Take 1 capsule (100 mg total) by mouth 2 (two) times daily.  Dispense:  10 capsule    Refill:  0    Return if symptoms worsen or fail to improve.  Gerre Scull, NP

## 2023-04-07 LAB — URINE CULTURE
MICRO NUMBER:: 15381337
SPECIMEN QUALITY:: ADEQUATE

## 2023-05-18 ENCOUNTER — Other Ambulatory Visit: Payer: Self-pay | Admitting: Nurse Practitioner

## 2023-05-19 ENCOUNTER — Telehealth: Payer: Self-pay | Admitting: Nurse Practitioner

## 2023-05-19 MED ORDER — LORATADINE 10 MG PO TABS: 10.0000 mg | ORAL_TABLET | Freq: Every day | ORAL | 3 refills | Status: DC

## 2023-05-19 NOTE — Telephone Encounter (Signed)
Requesting: loratadine  Last Visit: 04/06/2023 Next Visit: 07/13/2023 Last Refill: 12/17/2022 by Dr, Delorse Lek  Please Advise

## 2023-05-19 NOTE — Telephone Encounter (Signed)
Prescription Request  05/19/2023  LOV: 04/06/2023  What is the name of the medication or equipment? loratadine    Have you contacted your pharmacy to request a refill? Yes   Which pharmacy would you like this sent to?   [id number] [place], [place] - [phone] AT [facility] OF [facility] & [address] [place] [address] Phone: [phone] Fax: [phone]    Patient notified that their request is being sent to the clinical staff for review and that they should receive a response within 2 business days.   Please advise at Mobile [phone] (mobile)

## 2023-06-26 DIAGNOSIS — Z3046 Encounter for surveillance of implantable subdermal contraceptive: Secondary | ICD-10-CM | POA: Diagnosis not present

## 2023-06-26 DIAGNOSIS — E669 Obesity, unspecified: Secondary | ICD-10-CM | POA: Diagnosis not present

## 2023-06-26 DIAGNOSIS — Z30017 Encounter for initial prescription of implantable subdermal contraceptive: Secondary | ICD-10-CM | POA: Diagnosis not present

## 2023-06-26 DIAGNOSIS — R87615 Unsatisfactory cytologic smear of cervix: Secondary | ICD-10-CM | POA: Diagnosis not present

## 2023-06-26 DIAGNOSIS — Z01419 Encounter for gynecological examination (general) (routine) without abnormal findings: Secondary | ICD-10-CM | POA: Diagnosis not present

## 2023-06-26 DIAGNOSIS — Z3009 Encounter for other general counseling and advice on contraception: Secondary | ICD-10-CM | POA: Diagnosis not present

## 2023-06-26 DIAGNOSIS — N898 Other specified noninflammatory disorders of vagina: Secondary | ICD-10-CM | POA: Diagnosis not present

## 2023-06-26 LAB — RESULTS CONSOLE HPV: CHL HPV: NEGATIVE

## 2023-06-26 LAB — HM PAP SMEAR
HM Pap smear: NEGATIVE
HPV, high-risk: NEGATIVE

## 2023-06-29 ENCOUNTER — Telehealth: Payer: Self-pay | Admitting: Nurse Practitioner

## 2023-06-29 NOTE — Telephone Encounter (Signed)
Caller Name: Daveen Call back phone #: [phone]  Reason for Call: pt states she feels birth control medication keep her awake. She has tried OTC Melatonin. She is asking for a recommendation for something OTC.

## 2023-06-30 NOTE — Telephone Encounter (Signed)
I called and spoke with patient and notified her of below message and she will try one of the below recommendations.

## 2023-07-13 ENCOUNTER — Encounter: Payer: Self-pay | Admitting: Nurse Practitioner

## 2023-07-13 ENCOUNTER — Ambulatory Visit (INDEPENDENT_AMBULATORY_CARE_PROVIDER_SITE_OTHER): Payer: Medicaid Other | Admitting: Nurse Practitioner

## 2023-07-13 VITALS — BP 104/68 | HR 75 | Temp 97.4°F | Ht 63.0 in | Wt 170.2 lb

## 2023-07-13 DIAGNOSIS — R430 Anosmia: Secondary | ICD-10-CM | POA: Diagnosis not present

## 2023-07-13 DIAGNOSIS — E7800 Pure hypercholesterolemia, unspecified: Secondary | ICD-10-CM | POA: Diagnosis not present

## 2023-07-13 DIAGNOSIS — Z0000 Encounter for general adult medical examination without abnormal findings: Secondary | ICD-10-CM | POA: Diagnosis not present

## 2023-07-13 DIAGNOSIS — E559 Vitamin D deficiency, unspecified: Secondary | ICD-10-CM

## 2023-07-13 DIAGNOSIS — E538 Deficiency of other specified B group vitamins: Secondary | ICD-10-CM | POA: Diagnosis not present

## 2023-07-13 DIAGNOSIS — R7303 Prediabetes: Secondary | ICD-10-CM

## 2023-07-13 DIAGNOSIS — F5101 Primary insomnia: Secondary | ICD-10-CM

## 2023-07-13 DIAGNOSIS — T148XXA Other injury of unspecified body region, initial encounter: Secondary | ICD-10-CM | POA: Diagnosis not present

## 2023-07-13 LAB — LIPID PANEL
Cholesterol: 136 mg/dL (ref 0–200)
HDL: 33.4 mg/dL — ABNORMAL LOW (ref >39.00)
LDL Cholesterol: 76 mg/dL (ref 0–99)
NonHDL: 102.86
Total CHOL/HDL Ratio: 4
Triglycerides: 134 mg/dL (ref 0.0–149.0)
VLDL: 26.8 mg/dL (ref 0.0–40.0)

## 2023-07-13 LAB — COMPREHENSIVE METABOLIC PANEL
ALT: 8 U/L (ref 0–35)
AST: 13 U/L (ref 0–37)
Albumin: 4 g/dL (ref 3.5–5.2)
Alkaline Phosphatase: 46 U/L (ref 39–117)
BUN: 7 mg/dL (ref 6–23)
CO2: 26 meq/L (ref 19–32)
Calcium: 8.5 mg/dL (ref 8.4–10.5)
Chloride: 104 meq/L (ref 96–112)
Creatinine, Ser: 0.41 mg/dL (ref 0.40–1.20)
GFR: 125.31 mL/min (ref >60.00)
Glucose, Bld: 90 mg/dL (ref 70–99)
Potassium: 3.6 meq/L (ref 3.5–5.1)
Sodium: 137 meq/L (ref 135–145)
Total Bilirubin: 0.3 mg/dL (ref 0.2–1.2)
Total Protein: 6.8 g/dL (ref 6.0–8.3)

## 2023-07-13 LAB — CBC WITH DIFFERENTIAL/PLATELET
Basophils Absolute: 0 10*3/uL (ref 0.0–0.1)
Basophils Relative: 0.3 % (ref 0.0–3.0)
Eosinophils Absolute: 0.1 10*3/uL (ref 0.0–0.7)
Eosinophils Relative: 2 % (ref 0.0–5.0)
HCT: 37.5 % (ref 36.0–46.0)
Hemoglobin: 12.5 g/dL (ref 12.0–15.0)
Lymphocytes Relative: 32 % (ref 12.0–46.0)
Lymphs Abs: 1.8 10*3/uL (ref 0.7–4.0)
MCHC: 33.4 g/dL (ref 30.0–36.0)
MCV: 89.5 fL (ref 78.0–100.0)
Monocytes Absolute: 0.3 10*3/uL (ref 0.1–1.0)
Monocytes Relative: 5.4 % (ref 3.0–12.0)
Neutro Abs: 3.5 10*3/uL (ref 1.4–7.7)
Neutrophils Relative %: 60.3 % (ref 43.0–77.0)
Platelets: 240 10*3/uL (ref 150.0–400.0)
RBC: 4.19 Mil/uL (ref 3.87–5.11)
RDW: 13.7 % (ref 11.5–15.5)
WBC: 5.7 10*3/uL (ref 4.0–10.5)

## 2023-07-13 LAB — HEMOGLOBIN A1C: Hgb A1c MFr Bld: 5.9 % (ref 4.6–6.5)

## 2023-07-13 LAB — VITAMIN B12: Vitamin B-12: 295 pg/mL (ref 211–911)

## 2023-07-13 LAB — VITAMIN D 25 HYDROXY (VIT D DEFICIENCY, FRACTURES): VITD: 38.7 ng/mL (ref 30.00–100.00)

## 2023-07-13 MED ORDER — MUPIROCIN 2 % EX OINT: 1.0000 | TOPICAL_OINTMENT | Freq: Two times a day (BID) | CUTANEOUS | 0 refills | Status: DC

## 2023-07-13 NOTE — Assessment & Plan Note (Signed)
Chronic, stable.  Lipid panel has improved since changing her diet.  Continue with nutritional changes and slowly increase exercise as able. Check CMP, CBC, lipid panel today.

## 2023-07-13 NOTE — Assessment & Plan Note (Signed)
She has noticed a loss of smell and altered sense of taste since having the flu at the beginning of February 2024.  Discussed doing nose retraining with expelling the scent that she knows once to twice a day.  Will place referral to ENT.

## 2023-07-13 NOTE — Progress Notes (Signed)
BP 104/68 (BP Location: Right Arm)   Pulse 75   Temp (!) 97.4 F (36.3 C)   Ht 5\' 3"  (1.6 m)   Wt 170 lb 3.2 oz (77.2 kg)   SpO2 98%   BMI 30.15 kg/m    Subjective:    Patient ID: Wanda Norton, female    DOB: 03-02-1986, 37 y.o.   MRN: [id number]  CC: Chief Complaint  Patient presents with   Annual Exam    With fasting lab work, concerns with smelling    HPI: Wanda Norton is a 37 y.o. female presenting on 07/13/2023 for comprehensive medical examination. Current medical complaints include: loss of smell, spot on her nose  She is still having an altered sense of smell and taste since she had the flu in February 2024. She is interested in a referral to a specialist at this point.   She also had a wound start on her nose a week ago. She states this tends to happen twice a year. She denies itching, but it is painful to touch. She states that it is getting better. She was prescribed a pill to treat this the last time it happened, but it didn't help.   She currently lives with: husband, kids Menopausal Symptoms: no  Depression and Anxiety Screen done today and results listed below:     07/13/2023    9:19 AM 03/10/2023    2:46 PM 09/22/2022    4:21 PM 07/17/2022    9:28 AM 09/25/2021    9:25 AM  Depression screen PHQ 2/9  Decreased Interest 0 0 0 0 2  Down, Depressed, Hopeless 1 0 0 0 0  PHQ - 2 Score 1 0 0 0 2  Altered sleeping 3    0  Tired, decreased energy 3    0  Change in appetite 1    0  Feeling bad or failure about yourself  1    0  Trouble concentrating 1    0  Moving slowly or fidgety/restless 0    0  Suicidal thoughts 0    0  PHQ-9 Score 10    2  Difficult doing work/chores     Not difficult at all      07/13/2023    9:19 AM 09/25/2021    9:26 AM  GAD 7 : Generalized Anxiety Score  Nervous, Anxious, on Edge 1 0  Control/stop worrying 1 1  Worry too much - different things 3 2  Trouble relaxing 1 0  Restless 1 0  Easily annoyed or irritable 3 0  Afraid -  awful might happen 2 0  Total GAD 7 Score 12 3  Anxiety Difficulty  Not difficult at all    The patient does not have a history of falls. I did not complete a risk assessment for falls. A plan of care for falls was not documented.   Past Medical History:  Past Medical History:  Diagnosis Date   Allergy    Headache    Vitamin D deficiency     Surgical History:  Past Surgical History:  Procedure Laterality Date   NO PAST SURGERIES      Medications:  Current Outpatient Medications on File Prior to Visit  Medication Sig   acetaminophen (TYLENOL) 325 MG tablet Take 2 tablets (650 mg total) by mouth every 4 (four) hours as needed (for pain scale < 4).   etonogestrel (NEXPLANON) 68 MG IMPL implant 1 each by Subdermal route once. Placed in left arm  ibuprofen (ADVIL) 800 MG tablet Take 1 tablet (800 mg total) by mouth every 8 (eight) hours as needed (pain). Take with food.   lidocaine (LIDODERM) 5 % Place 1 patch onto the skin daily. Remove & Discard patch within 12 hours or as directed by MD   loratadine (CLARITIN) 10 MG tablet Take 1 tablet (10 mg total) by mouth daily.   UNABLE TO FIND Med Name: Vitamin D3 and K2   methocarbamol (ROBAXIN) 500 MG tablet Take 1 tablet (500 mg total) by mouth 2 (two) times daily. (Patient not taking: Reported on 04/06/2023)   No current facility-administered medications on file prior to visit.    Allergies:  No Known Allergies  Social History:  Social History   Socioeconomic History   Marital status: Married    Spouse name: Not on file   Number of children: Not on file   Years of education: Not on file   Highest education level: Not on file  Occupational History   Not on file  Tobacco Use   Smoking status: Never   Smokeless tobacco: Never  Vaping Use   Vaping status: Never Used  Substance and Sexual Activity   Alcohol use: No   Drug use: No   Sexual activity: Yes  Other Topics Concern   Not on file  Social History Narrative   Not  on file   Social Determinants of Health   Financial Resource Strain: Not on file  Food Insecurity: Not on file  Transportation Needs: Not on file  Physical Activity: Not on file  Stress: Not on file  Social Connections: Not on file  Intimate Partner Violence: Not on file   Social History   Tobacco Use  Smoking Status Never  Smokeless Tobacco Never   Social History   Substance and Sexual Activity  Alcohol Use No    Family History:  Family History  Problem Relation Age of Onset   Alcohol abuse Neg Hx    Arthritis Neg Hx    Asthma Neg Hx    Birth defects Neg Hx    Cancer Neg Hx    COPD Neg Hx    Depression Neg Hx    Diabetes Neg Hx    Drug abuse Neg Hx    Early death Neg Hx    Hearing loss Neg Hx    Heart disease Neg Hx    Hyperlipidemia Neg Hx    Hypertension Neg Hx    Kidney disease Neg Hx    Learning disabilities Neg Hx    Mental illness Neg Hx    Mental retardation Neg Hx    Miscarriages / Stillbirths Neg Hx    Stroke Neg Hx    Vision loss Neg Hx    Varicose Veins Neg Hx    Allergic rhinitis Neg Hx    Eczema Neg Hx    Urticaria Neg Hx     Past medical history, surgical history, medications, allergies, family history and social history reviewed with patient today and changes made to appropriate areas of the chart.   Review of Systems  Constitutional: Negative.   HENT:  Negative for congestion and sore throat.        Change in sense of smell and taste  Eyes: Negative.   Respiratory: Negative.    Cardiovascular: Negative.   Gastrointestinal: Negative.   Genitourinary: Negative.   Musculoskeletal: Negative.   Skin:        Wound to nose  Neurological: Negative.   Psychiatric/Behavioral: Negative.  All other ROS negative except what is listed above and in the HPI.      Objective:    BP 104/68 (BP Location: Right Arm)   Pulse 75   Temp (!) 97.4 F (36.3 C)   Ht 5\' 3"  (1.6 m)   Wt 170 lb 3.2 oz (77.2 kg)   SpO2 98%   BMI 30.15 kg/m   Wt  Readings from Last 3 Encounters:  07/13/23 170 lb 3.2 oz (77.2 kg)  04/06/23 165 lb (74.8 kg)  03/10/23 168 lb 3.2 oz (76.3 kg)    Physical Exam Vitals and nursing note reviewed.  Constitutional:      General: She is not in acute distress.    Appearance: Normal appearance.  HENT:     Head: Normocephalic and atraumatic.     Right Ear: Tympanic membrane, ear canal and external ear normal.     Left Ear: Tympanic membrane, ear canal and external ear normal.  Eyes:     Conjunctiva/sclera: Conjunctivae normal.  Cardiovascular:     Rate and Rhythm: Normal rate and regular rhythm.     Pulses: Normal pulses.     Heart sounds: Normal heart sounds.  Pulmonary:     Effort: Pulmonary effort is normal.     Breath sounds: Normal breath sounds.  Abdominal:     Palpations: Abdomen is soft.     Tenderness: There is no abdominal tenderness.  Musculoskeletal:        General: Normal range of motion.     Cervical back: Normal range of motion and neck supple.     Right lower leg: No edema.     Left lower leg: No edema.  Lymphadenopathy:     Cervical: No cervical adenopathy.  Skin:    General: Skin is warm and dry.     Comments: Abrasion to left nare  Neurological:     General: No focal deficit present.     Mental Status: She is alert and oriented to person, place, and time.     Cranial Nerves: No cranial nerve deficit.     Coordination: Coordination normal.     Gait: Gait normal.  Psychiatric:        Mood and Affect: Mood normal.        Behavior: Behavior normal.        Thought Content: Thought content normal.        Judgment: Judgment normal.     Results for orders placed or performed in visit on 04/06/23  Urine Culture   Specimen: Urine  Result Value Ref Range   MICRO NUMBER: [id number]    SPECIMEN QUALITY: Adequate    Sample Source URINE    STATUS: FINAL    Result:      Less than 10,000 CFU/mL of single Gram negative organism isolated. No further testing will be performed. If  clinically indicated, recollection using a method to minimize contamination, with prompt transfer to Urine Culture Transport Tube, is recommended.  POCT Urinalysis Dipstick  Result Value Ref Range   Color, UA     Clarity, UA     Glucose, UA Negative Negative   Bilirubin, UA Negative    Ketones, UA Negative    Spec Grav, UA >=1.030 (A) 1.010 - 1.025   Blood, UA Positive    pH, UA 5.5 5.0 - 8.0   Protein, UA Positive (A) Negative   Urobilinogen, UA 0.2 0.2 or 1.0 E.U./dL   Nitrite, UA Negative    Leukocytes, UA Moderate (2+) (  A) Negative   Appearance     Odor        Assessment & Plan:   Problem List Items Addressed This Visit       Other   Loss of smell    She has noticed a loss of smell and altered sense of taste since having the flu at the beginning of February 2024.  Discussed doing nose retraining with expelling the scent that she knows once to twice a day.  Will place referral to ENT.      Insomnia    Chronic, ongoing. She has tried camomile tea in addition to unisom and melatonin. She is going to get her nexplanon removed on Wednesday and will see if this helps with her sleep. Follow-up if symptoms worsen or any concerns.       Vitamin D deficiency    Chronic, stable.  Continue vitamin D supplement daily. Check vitamin D levels today and adjust regimen based on results.       Relevant Orders   VITAMIN D 25 Hydroxy (Vit-D Deficiency, Fractures)   Prediabetes    Chronic, stable.  Most recent A1c was 5.6% is back into normal range.  Congratulated her on her nutrition changes. Check A1c today       Relevant Orders   Hemoglobin A1c   Pure hypercholesterolemia    Chronic, stable.  Lipid panel has improved since changing her diet.  Continue with nutritional changes and slowly increase exercise as able. Check CMP, CBC, lipid panel today.       Relevant Orders   CBC with Differential/Platelet   Comprehensive metabolic panel   Lipid panel   Routine general medical  examination at a health care facility - Primary    Health maintenance reviewed and updated. Discussed nutrition, exercise. Check CMP, CBC today. Follow-up 1 year.        B12 deficiency    Check vitamin B12 levels today and treat based on results.       Relevant Orders   Vitamin B12   Other Visit Diagnoses     Abrasion       Keep area clean and dry. Apply thin layer of mupirocin ointment twice a day. F/U if not improving.        Follow up plan: Return in about 1 year (around 07/12/2024) for CPE.   LABORATORY TESTING:  - Pap smear: done elsewhere  IMMUNIZATIONS:   - Tdap: Tetanus vaccination status reviewed: last tetanus booster within 10 years. - Influenza:  Declined today - Pneumovax: Not applicable - Prevnar: Not applicable - HPV: Not applicable - Shingrix vaccine: Not applicable  SCREENING: -Mammogram: Not applicable  - Colonoscopy: Not applicable  - Bone Density: Not applicable   PATIENT COUNSELING:   Advised to take 1 mg of folate supplement per day if capable of pregnancy.   Sexuality: Discussed sexually transmitted diseases, partner selection, use of condoms, avoidance of unintended pregnancy  and contraceptive alternatives.   Advised to avoid cigarette smoking.  I discussed with the patient that most people either abstain from alcohol or drink within safe limits (<=14/week and <=4 drinks/occasion for males, <=7/weeks and <= 3 drinks/occasion for females) and that the risk for alcohol disorders and other health effects rises proportionally with the number of drinks per week and how often a drinker exceeds daily limits.  Discussed cessation/primary prevention of drug use and availability of treatment for abuse.   Diet: Encouraged to adjust caloric intake to maintain  or achieve ideal body weight,  to reduce intake of dietary saturated fat and total fat, to limit sodium intake by avoiding high sodium foods and not adding table salt, and to maintain adequate  dietary potassium and calcium preferably from fresh fruits, vegetables, and low-fat dairy products.    stressed the importance of regular exercise  Injury prevention: Discussed safety belts, safety helmets, smoke detector, smoking near bedding or upholstery.   Dental health: Discussed importance of regular tooth brushing, flossing, and dental visits.    NEXT PREVENTATIVE PHYSICAL DUE IN 1 YEAR. Return in about 1 year (around 07/12/2024) for CPE.  Wanda Norton A Laree Garron

## 2023-07-13 NOTE — Patient Instructions (Addendum)
It was great to see you!  Start mupirocin cream twice a day to the spot on your nose  I have placed a referral to ENT, they will call to schedule.   You can start a daily multivitamin for women.  We are checking your labs today and will let you know the results via mychart/phone.   Let's follow-up in 1 year, sooner if you have concerns.  If a referral was placed today, you will be contacted for an appointment. Please note that routine referrals can sometimes take up to 3-4 weeks to process. Please call our office if you haven't heard anything after this time frame.  Take care,  Rodman Pickle, NP

## 2023-07-13 NOTE — Assessment & Plan Note (Signed)
Chronic, stable.  Most recent A1c was 5.6% is back into normal range.  Congratulated her on her nutrition changes. Check A1c today

## 2023-07-13 NOTE — Assessment & Plan Note (Signed)
Health maintenance reviewed and updated. Discussed nutrition, exercise. Check CMP, CBC today. Follow-up 1 year.   

## 2023-07-13 NOTE — Assessment & Plan Note (Signed)
Check vitamin B12 levels today and treat based on results.

## 2023-07-13 NOTE — Assessment & Plan Note (Signed)
Chronic, ongoing. She has tried camomile tea in addition to unisom and melatonin. She is going to get her nexplanon removed on Wednesday and will see if this helps with her sleep. Follow-up if symptoms worsen or any concerns.

## 2023-07-13 NOTE — Assessment & Plan Note (Signed)
Chronic, stable. Continue vitamin D supplement daily.  Check vitamin D levels today and adjust regimen based on results

## 2023-07-14 ENCOUNTER — Encounter: Payer: Self-pay | Admitting: Nurse Practitioner

## 2023-07-15 DIAGNOSIS — Z3009 Encounter for other general counseling and advice on contraception: Secondary | ICD-10-CM | POA: Diagnosis not present

## 2023-07-15 DIAGNOSIS — Z3046 Encounter for surveillance of implantable subdermal contraceptive: Secondary | ICD-10-CM | POA: Diagnosis not present

## 2023-07-20 ENCOUNTER — Encounter (INDEPENDENT_AMBULATORY_CARE_PROVIDER_SITE_OTHER): Payer: Self-pay | Admitting: Otolaryngology

## 2023-07-31 ENCOUNTER — Encounter (INDEPENDENT_AMBULATORY_CARE_PROVIDER_SITE_OTHER): Payer: Self-pay

## 2023-08-25 ENCOUNTER — Ambulatory Visit (INDEPENDENT_AMBULATORY_CARE_PROVIDER_SITE_OTHER): Payer: Medicaid Other

## 2023-08-25 VITALS — BP 120/87 | HR 65

## 2023-08-25 DIAGNOSIS — Z3202 Encounter for pregnancy test, result negative: Secondary | ICD-10-CM

## 2023-08-25 DIAGNOSIS — Z3200 Encounter for pregnancy test, result unknown: Secondary | ICD-10-CM

## 2023-08-25 LAB — POCT PREGNANCY, URINE: Preg Test, Ur: NEGATIVE

## 2023-08-25 NOTE — Progress Notes (Signed)
 Possible Pregnancy  Here today for pregnancy confirmation. UPT in office today is negative. Pt reports first positive home UPT today. Reports having Nexplanon  removal 2 months prior at the [facility] Department due to headaches. Using withdrawal method for contraception. This is an unplanned pregnancy. Pt does not desire pregnancy. Pt scheduled to return in 1 week for AM appointment to recheck UPT in office. Offered for patient to take a second home UPT tomorrow for confirmation if positive or negative.   Vernell FORBES Ruddle, RN 08/25/2023  3:34 PM

## 2023-08-26 ENCOUNTER — Encounter: Payer: Self-pay | Admitting: Nurse Practitioner

## 2023-08-26 ENCOUNTER — Ambulatory Visit: Payer: Medicaid Other | Admitting: Nurse Practitioner

## 2023-08-26 VITALS — BP 110/78 | HR 92 | Temp 98.2°F | Ht 63.0 in | Wt 167.0 lb

## 2023-08-26 DIAGNOSIS — J069 Acute upper respiratory infection, unspecified: Secondary | ICD-10-CM | POA: Diagnosis not present

## 2023-08-26 DIAGNOSIS — N926 Irregular menstruation, unspecified: Secondary | ICD-10-CM | POA: Diagnosis not present

## 2023-08-26 HISTORY — DX: Acute upper respiratory infection, unspecified: J06.9

## 2023-08-26 LAB — POC COVID19 BINAXNOW: SARS Coronavirus 2 Ag: NEGATIVE

## 2023-08-26 LAB — POCT INFLUENZA A/B
Influenza A, POC: NEGATIVE
Influenza B, POC: NEGATIVE

## 2023-08-26 NOTE — Assessment & Plan Note (Signed)
 Nexplanon  was removed on December 4th, and unprotected intercourse has occurred since. A home pregnancy test is positive. She does not wish to continue the pregnancy if confirmed. Order a blood pregnancy test. If positive, contact [facility] for [facility]. Plan is for her husband to get a vasectomy.

## 2023-08-26 NOTE — Assessment & Plan Note (Signed)
 Symptoms include body aches, headache, sneezing, and runny nose, with no cough, sore throat, or fever. Flu and COVID tests are negative. Continue ibuprofen  as needed for pain and fever. Consider over-the-counter antihistamines for the runny nose.

## 2023-08-26 NOTE — Patient Instructions (Signed)
 It was great to see you!  We are checking your labs today and will let you know the results via mychart/phone.   Keep drinking plenty of water/tea  You can keep taking ibuprofen /tylenol  as needed  [facility] Address: [address] [place], [place] [address] Phone: [phone]  Let's follow-up with any concerns or worsening symptoms  Take care,  Rheba Cedar, NP

## 2023-08-26 NOTE — Progress Notes (Signed)
 Acute Office Visit  Subjective:     Patient ID: Wanda Norton, female    DOB: [birth year], 38 y.o.   MRN: [id number]  Chief Complaint  Patient presents with   Cough    With body aches, request blood work for pregnancy testing    HPI Patient is in today for body aches, runny nose for 10 days. She also notes a missed period.  Discussed the use of AI scribe software for clinical note transcription with the patient, who gave verbal consent to proceed.  History of Present Illness   The patient, with a history of Nexplanon  use, presents with concerns of a possible pregnancy. She had her Nexplanon  removed on December 4th and has been practicing withdrawal method for contraception since. She reports having a period around December 10th. Recently, she took two home pregnancy tests, one of which was positive and the other negative. She is very distressed about the possibility of being pregnant, as she already has four children and does not wish to have more. She reports having a lot of vaginal discharge since the removal of the Nexplanon .  In addition, the patient has been experiencing symptoms of a viral illness since January 6th. She reports feeling very cold, having a headache, sneezing, a runny nose, and body aches. She has been taking over-the-counter cough medicine and ibuprofen  for symptom relief. She also reports having significant back pain and heaviness in her hands.      ROS See pertinent positives and negatives per HPI.     Objective:    BP 110/78 (BP Location: Left Arm, Patient Position: Sitting, Cuff Size: Normal)   Pulse 92   Temp 98.2 F (36.8 C)   Ht 5\' 3"  (1.6 m)   Wt 167 lb (75.8 kg)   LMP 07/21/2023 (Exact Date)   SpO2 100%   BMI 29.58 kg/m    Physical Exam Vitals and nursing note reviewed.  Constitutional:      General: She is not in acute distress.    Appearance: Normal appearance.  HENT:     Head: Normocephalic.     Right Ear: Tympanic membrane, ear canal  and external ear normal.     Left Ear: Tympanic membrane, ear canal and external ear normal.  Eyes:     Conjunctiva/sclera: Conjunctivae normal.  Cardiovascular:     Rate and Rhythm: Normal rate and regular rhythm.     Pulses: Normal pulses.     Heart sounds: Normal heart sounds.  Pulmonary:     Effort: Pulmonary effort is normal.     Breath sounds: Normal breath sounds.  Musculoskeletal:     Cervical back: Normal range of motion and neck supple. No tenderness.  Lymphadenopathy:     Cervical: No cervical adenopathy.  Skin:    General: Skin is warm.  Neurological:     General: No focal deficit present.     Mental Status: She is alert and oriented to person, place, and time.  Psychiatric:        Mood and Affect: Mood normal.        Behavior: Behavior normal.        Thought Content: Thought content normal.        Judgment: Judgment normal.     Results for orders placed or performed in visit on 08/26/23  POCT Influenza A/B  Result Value Ref Range   Influenza A, POC Negative Negative   Influenza B, POC Negative Negative  POC COVID-19 BinaxNow  Result Value Ref Range  SARS Coronavirus 2 Ag Negative Negative        Assessment & Plan:   Problem List Items Addressed This Visit       Respiratory   Upper respiratory tract infection   Symptoms include body aches, headache, sneezing, and runny nose, with no cough, sore throat, or fever. Flu and COVID tests are negative. Continue ibuprofen  as needed for pain and fever. Consider over-the-counter antihistamines for the runny nose.       Relevant Orders   POCT Influenza A/B (Completed)   POC COVID-19 BinaxNow (Completed)     Other   Missed period - Primary   Nexplanon  was removed on December 4th, and unprotected intercourse has occurred since. A home pregnancy test is positive. She does not wish to continue the pregnancy if confirmed. Order a blood pregnancy test. If positive, contact [facility] for [facility]  options. Plan is for her husband to get a vasectomy.       Relevant Orders   B-HCG Quant   No orders of the defined types were placed in this encounter.   Return if symptoms worsen or fail to improve.  Odette Benjamin, NP

## 2023-08-27 ENCOUNTER — Telehealth: Payer: Self-pay | Admitting: Family Medicine

## 2023-08-27 ENCOUNTER — Encounter: Payer: Self-pay | Admitting: Nurse Practitioner

## 2023-08-27 ENCOUNTER — Ambulatory Visit: Payer: Medicaid Other | Admitting: Nurse Practitioner

## 2023-08-27 LAB — HCG, QUANTITATIVE, PREGNANCY: Quantitative HCG: <0.6 m[IU]/mL

## 2023-08-27 NOTE — Telephone Encounter (Signed)
Patient calling about scheduling an appointment for her husbands vasectomy. I took down his information and hers and told her that I will give this information to my team lead and someone will reach out to her.

## 2023-08-28 ENCOUNTER — Telehealth: Payer: Self-pay | Admitting: Family Medicine

## 2023-08-28 NOTE — Telephone Encounter (Signed)
Left message for patient to give [place] a call back regarding her husbands vasectomy.

## 2023-08-31 ENCOUNTER — Telehealth (INDEPENDENT_AMBULATORY_CARE_PROVIDER_SITE_OTHER): Payer: Self-pay | Admitting: Otolaryngology

## 2023-08-31 NOTE — Telephone Encounter (Signed)
Reminder Call: Date: 09/01/2023 Status: Sch  Time: 10:30 AM [address] [place], [place] [address]  Left Voicemail with address

## 2023-09-01 ENCOUNTER — Ambulatory Visit (INDEPENDENT_AMBULATORY_CARE_PROVIDER_SITE_OTHER): Payer: Medicaid Other | Admitting: Otolaryngology

## 2023-09-01 ENCOUNTER — Encounter (INDEPENDENT_AMBULATORY_CARE_PROVIDER_SITE_OTHER): Payer: Self-pay

## 2023-09-01 VITALS — BP 117/77 | HR 77 | Resp 19 | Ht 63.0 in | Wt 167.0 lb

## 2023-09-01 DIAGNOSIS — J342 Deviated nasal septum: Secondary | ICD-10-CM

## 2023-09-01 DIAGNOSIS — R438 Other disturbances of smell and taste: Secondary | ICD-10-CM | POA: Diagnosis not present

## 2023-09-01 MED ORDER — FLUTICASONE PROPIONATE 50 MCG/ACT NA SUSP: 2.0000 | Freq: Two times a day (BID) | NASAL | 6 refills | Status: DC

## 2023-09-01 NOTE — Patient Instructions (Addendum)
Use flonase two sprays each nostril two times per day per day - for 2 months Lloyd Huger Med Nasal Saline Rinse  -- use it as needed but a few times per week to see if it slowly improves your symptoms Use the neilmed smell retaining kit (buy on [place] or [facility]) - twice per day for 2 months  - start nasal saline rinses with NeilMed Bottle available over the counter    Nasal Saline Irrigation instructions: If you choose to make your own salt water solution, You will need: Salt (kosher, canning, or pickling salt) Baking soda Nasal irrigation bottle (i.e. Lloyd Huger Med Sinus Rinse) Measuring spoon ( teaspoon)  Use Distilled / boiled water - NOT tap water   Mix solution Mix 1 teaspoon of salt, 1/2 teaspoon of baking soda and 1 cup of water into irrigation bottle ** May use saline packet instead of homemade recipe for this step if you prefer If medicine was prescribed to be mixed with solution, place this into bottle Examples 2 inches of 2% mupirocin ointment Budesonide solution Position your head: Lean over sink (about 45 degrees) Rotate head (about 45 degrees) so that one nostril is above the other Irrigate Insert tip of irrigation bottle into upper nostril so it forms a comfortable seal Irrigate while breathing through your mouth May remove the straw from the bottle in order to irrigate the entire solution (important if medicine was added) Exhale through nose when finished and blow nose as necessary  Repeat on opposite side with other 1/2 of solution (120 mL) or remake solution if all 240 mL was used on first side Wash irrigation bottle regularly, replace every 3 months

## 2023-09-01 NOTE — Progress Notes (Signed)
Dear Dr. Norval Morton, Here is my assessment for our mutual patient, Wanda Norton. Thank you for allowing me the opportunity to care for your patient. Please do not hesitate to contact me should you have any other questions. Sincerely, Dr. Jovita Kussmaul  Otolaryngology Clinic Note  HISTORY: Wanda Norton is a 38 y.o. female kindly referred by Dr. Norval Morton for evaluation of hyposmia and loss of taste.  Initial visit (08/2023):  She reports started about 2 years ago, does not remember exact antecedent event but reports maybe after URI. She does report she has had COVID 3-4 times. Reports that her sense of smell at all (has a young daughter and can't smell dirty diapers well) - all of them. No phantosmia, maybe some dysosmia. She tried some smell re-training for a week but did not work. Taste is fine. She otherwise denies other sinonasal symptoms - nasal obstruction, congestion, discolored drainage, facial pressure/pain. Never gets sinus infections. Symptom severity is moderate - not improvement.  Improvement occurred with nothing - have not tried anything.  No allergy symptoms.  Allergy testing has been done (12/2022): No previous sinonasal surgery. She did see Dr. Delorse Lek previously for rash/itching.  No neurological symptoms  She is currently using no nasal sprays or tried any consistent nasal sprays.  No recent medication changes. Takes claritin PRN No vitamin deficiencies (B12), no mental status changes  GLP-1: no AP/AC: no  Tobacco: no Lives in [place], [place] Works part time in daycare  PMHx: Seasonal Allergies  RADIOGRAPHIC EVALUATION AND INDEPENDENT REVIEW OF OTHER RECORDS:: Lauren McElwee (07/13/2023): altered sense of smell and taste since flu in Feb 2024. Wound on her nose - twice a year, painful to touch; Dx: Loss of smell; Rx: nose re-training; ref to ENT Labs: B12 07/13/2023 - wnl; CBC and CMP 07/13/2023: wnl, Eos 100; TSH 12/17/2022: wnl Dr. Delorse Lek (All/Imm): rash and itching; no other  sx; Dx: Pruritus; Rx: Claritin, pepcid PRN Allergy testing/RAST independently reviewed and interpreted:   Past Medical History:  Diagnosis Date   Allergy    Headache    Upper respiratory tract infection 08/26/2023   Vitamin D deficiency    Past Surgical History:  Procedure Laterality Date   NO PAST SURGERIES     Family History  Problem Relation Age of Onset   Alcohol abuse Neg Hx    Arthritis Neg Hx    Asthma Neg Hx    Birth defects Neg Hx    Cancer Neg Hx    COPD Neg Hx    Depression Neg Hx    Diabetes Neg Hx    Drug abuse Neg Hx    Early death Neg Hx    Hearing loss Neg Hx    Heart disease Neg Hx    Hyperlipidemia Neg Hx    Hypertension Neg Hx    Kidney disease Neg Hx    Learning disabilities Neg Hx    Mental illness Neg Hx    Mental retardation Neg Hx    Miscarriages / Stillbirths Neg Hx    Stroke Neg Hx    Vision loss Neg Hx    Varicose Veins Neg Hx    Allergic rhinitis Neg Hx    Eczema Neg Hx    Urticaria Neg Hx    Social History   Tobacco Use   Smoking status: Never   Smokeless tobacco: Never  Substance Use Topics   Alcohol use: No   No Known Allergies Current Outpatient Medications  Medication Sig Dispense Refill   acetaminophen (TYLENOL)  325 MG tablet Take 2 tablets (650 mg total) by mouth every 4 (four) hours as needed (for pain scale < 4).     fluticasone (FLONASE) 50 MCG/ACT nasal spray Place 2 sprays into both nostrils in the morning and at bedtime. 16 g 6   ibuprofen (ADVIL) 800 MG tablet Take 1 tablet (800 mg total) by mouth every 8 (eight) hours as needed (pain). Take with food. 30 tablet 0   loratadine (CLARITIN) 10 MG tablet Take 1 tablet (10 mg total) by mouth daily. 90 tablet 3   lidocaine (LIDODERM) 5 % Place 1 patch onto the skin daily. Remove & Discard patch within 12 hours or as directed by MD (Patient not taking: Reported on 09/01/2023) 12 patch 0   No current facility-administered medications for this visit.   BP 117/77 (BP  Location: Left Arm, Patient Position: Sitting, Cuff Size: Normal)   Pulse 77   Resp 19   Ht 5\' 3"  (1.6 m)   Wt 167 lb (75.8 kg)   LMP 07/21/2023 (Exact Date)   SpO2 97%   BMI 29.58 kg/m   PHYSICAL EXAM:  BP 117/77 (BP Location: Left Arm, Patient Position: Sitting, Cuff Size: Normal)   Pulse 77   Resp 19   Ht 5\' 3"  (1.6 m)   Wt 167 lb (75.8 kg)   LMP 07/21/2023 (Exact Date)   SpO2 97%   BMI 29.58 kg/m    Salient findings:  CN II-XII intact  Bilateral EAC clear and TM intact with well pneumatized middle ear spaces Nose: Anterior rhinoscopy reveals septum dev left, no significant bilateral inferior turbinate hypertrophy.  Nasal endoscopy was indicated to better evaluate the nose and paranasal sinuses, given the patient's history and exam findings, and is detailed below. No lesions of oral cavity/oropharynx; No obviously palpable neck masses/lymphadenopathy/thyromegaly No respiratory distress or stridor   PROCEDURE: Diagnostic Nasal Endoscopy Pre-procedure diagnosis: Hyposmia, rule out structural cause Post-procedure diagnosis: same Indication: See pre-procedure diagnosis and physical exam above Complications: None apparent EBL: 0 mL Anesthesia: Lidocaine 4% and topical decongestant was topically sprayed in each nasal cavity  Description of Procedure:  Patient was identified. A rigid 30 degree endoscope was utilized to evaluate the sinonasal cavities, mucosa, sinus ostia and turbinates and septum.  Overall, signs of mucosal inflammation are not noted.  Also noted are no lesions over b/l visualized olfactory cleft (left less so visualized given septal deviation).  No mucopurulence, polyps, or masses noted.   Right Middle meatus: clear Right SE Recess: clear Left MM: clear Left SE Recess: clear   Photodocumentation was obtained.  CPT CODE -- 31231 - Mod 25   ASSESSMENT:  38 y.o. F with multiple COVID episodes who reports:  1. Hyposmia   2. Nasal septal deviation     Reported hyposmia for past two years, not improving. Endoscopy reassuring, no polyps or lesions over visualized olfactory cleft We've discussed issues and options today.  We reviewed the nasal endoscopy images together.  The risks, benefits and alternatives were discussed and questions answered.  She has elected to proceed with medical management. I explained that given length of time, it may not improve. I also discussed the wide DDX for this which includes vitamin/mineral deficiencies, meds, inflammatory, idiopathic, neurologic or malignant causes. We will try to treat empirically for inflammatory cause in case of low level inflammation  - Start Flonase BID - Daily Sinus rinses - Will try smell retraining kit consistently - Offered MRI, deferred - f/u 3 months; if no  improvement, consider MRI  See below regarding exact medications prescribed this encounter including dosages and route: Meds ordered this encounter  Medications   fluticasone (FLONASE) 50 MCG/ACT nasal spray    Sig: Place 2 sprays into both nostrils in the morning and at bedtime.    Dispense:  16 g    Refill:  6     Thank you for allowing me the opportunity to care for your patient. Please do not hesitate to contact me should you have any other questions.  Sincerely, Jovita Kussmaul, MD Otolarynoglogist (ENT), [facility] Phone: [phone] Fax: [phone]  MDM:  Level 4: [id number] Complexity/Problems addressed:consistent chronic problem Data complexity: mod - independent interpretation of notes, labs and testing - Morbidity: mod  - Prescription Drug prescribed or managed: yes  09/01/2023, 11:26 AM

## 2023-09-02 ENCOUNTER — Ambulatory Visit: Payer: Medicaid Other

## 2023-09-04 ENCOUNTER — Telehealth: Payer: Self-pay

## 2023-09-04 NOTE — Telephone Encounter (Signed)
Patient's last menstrual was 07/21/2023 and she saw Lauren in office regarding this and had lab work on 08-27-2023.

## 2023-09-04 NOTE — Telephone Encounter (Signed)
Copied from CRM [id number]. Topic: Clinical - Medical Advice >> Sep 04, 2023 11:45 AM Hector Shade B wrote: Reason for CRM: Patient called in stating that she had taken pregnancy test via labs and some at home which shown to be negative. Patient has asked for advice on when she can take another test because she has not yet seen her cycle. Please call patient to advise at [phone]

## 2023-09-07 NOTE — Telephone Encounter (Signed)
I called and spoke with patient and she said that she had some blood when she wiped yesterday and then the next time it was brownish. She said there is no blood today.

## 2023-11-16 ENCOUNTER — Other Ambulatory Visit: Payer: Self-pay | Admitting: Nurse Practitioner

## 2023-11-16 ENCOUNTER — Other Ambulatory Visit

## 2023-11-16 DIAGNOSIS — K219 Gastro-esophageal reflux disease without esophagitis: Secondary | ICD-10-CM | POA: Diagnosis not present

## 2023-11-16 DIAGNOSIS — A048 Other specified bacterial intestinal infections: Secondary | ICD-10-CM

## 2023-11-16 NOTE — Progress Notes (Signed)
 Husband and daughter have h pylori and it was recommended the entire family get tested. H pylori test ordered today.

## 2023-11-17 ENCOUNTER — Other Ambulatory Visit: Payer: Self-pay | Admitting: Nurse Practitioner

## 2023-11-17 ENCOUNTER — Encounter: Payer: Self-pay | Admitting: Nurse Practitioner

## 2023-11-17 DIAGNOSIS — A048 Other specified bacterial intestinal infections: Secondary | ICD-10-CM

## 2023-11-17 LAB — H. PYLORI BREATH TEST: H. pylori Breath Test: DETECTED — AB

## 2023-11-17 MED ORDER — DOXYCYCLINE HYCLATE 100 MG PO TABS: 100.0000 mg | ORAL_TABLET | Freq: Two times a day (BID) | ORAL | 0 refills | Status: DC

## 2023-11-17 MED ORDER — BISMUTH SUBSALICYLATE 525 MG PO TABS: 1.0000 | ORAL_TABLET | Freq: Four times a day (QID) | ORAL | 0 refills | Status: DC

## 2023-11-17 MED ORDER — PANTOPRAZOLE SODIUM 40 MG PO TBEC: 40.0000 mg | DELAYED_RELEASE_TABLET | Freq: Two times a day (BID) | ORAL | 0 refills | Status: DC

## 2023-11-17 MED ORDER — TETRACYCLINE HCL 500 MG PO CAPS: 500.0000 mg | ORAL_CAPSULE | Freq: Four times a day (QID) | ORAL | 0 refills | Status: DC

## 2023-11-17 MED ORDER — METRONIDAZOLE 250 MG PO TABS: 250.0000 mg | ORAL_TABLET | Freq: Four times a day (QID) | ORAL | 0 refills | Status: DC

## 2023-11-17 NOTE — Addendum Note (Signed)
 Addended by: Rodman Pickle A on: 11/17/2023 08:07 AM   Modules accepted: Orders

## 2023-11-18 ENCOUNTER — Telehealth: Payer: Self-pay | Admitting: Nurse Practitioner

## 2023-11-18 NOTE — Telephone Encounter (Signed)
 Copied from CRM [id number]. Topic: General - Other >> Nov 18, 2023  2:41 PM Truddie Crumble wrote: Reason for CRM: patient returning a call to the office

## 2023-11-18 NOTE — Telephone Encounter (Signed)
 I returned patient's call and documented under her daughter's lab results.

## 2023-11-18 NOTE — Telephone Encounter (Signed)
 Pt stated she was returning your call. I could not find any documentation about a call. Please call.

## 2023-12-01 ENCOUNTER — Ambulatory Visit (INDEPENDENT_AMBULATORY_CARE_PROVIDER_SITE_OTHER): Payer: Medicaid Other

## 2023-12-22 ENCOUNTER — Encounter: Payer: Self-pay | Admitting: Obstetrics and Gynecology

## 2023-12-22 ENCOUNTER — Ambulatory Visit: Admitting: Obstetrics and Gynecology

## 2023-12-22 VITALS — BP 103/71 | HR 67 | Ht 63.0 in | Wt 164.8 lb

## 2023-12-22 DIAGNOSIS — Z3009 Encounter for other general counseling and advice on contraception: Secondary | ICD-10-CM

## 2023-12-22 DIAGNOSIS — Z1331 Encounter for screening for depression: Secondary | ICD-10-CM

## 2023-12-22 NOTE — Progress Notes (Signed)
 Pt presents for discussion about having a tubal.

## 2023-12-22 NOTE — Progress Notes (Signed)
 GYNECOLOGY VISIT  Patient name: Wanda Norton MRN [id number]  Date of birth: 03-17-1986  Discussed the use of AI scribe software for clinical note transcription with the patient, who gave verbal consent to proceed.  Chief Complaint:   Sterilization (Discuss)  History:  Wanda Norton is a 38 y.o. [phone] being seen today for tubal consult.  History of Present Illness A 38 year old female who presents for consultation regarding tubal ligation.  She is interested in undergoing a tubal ligation and has no prior knowledge about the procedure. She learned about it from her God mother and a friend who has undergone the procedure.  She has had four deliveries and has four children. She is certain that she does not want any more children.  She understands that the procedure involves surgery and is aware of the different methods, including burning the tubes, removing a section, or complete removal of the tubes. She is aware that the procedure is irreversible and that if she wishes to conceive in the future, in vitro fertilization (IVF) would be required.      Past Medical History:  Diagnosis Date   Allergy    Headache    Upper respiratory tract infection 08/26/2023   Vitamin D  deficiency     Past Surgical History:  Procedure Laterality Date   NO PAST SURGERIES      The following portions of the patient's history were reviewed and updated as appropriate: allergies, current medications, past family history, past medical history, past social history, past surgical history and problem list.   Health Maintenance:   Last pap 06/2023 NILM, HPV negative Last mammogram: n/a   Review of Systems:  Pertinent items are noted in HPI. Comprehensive review of systems was otherwise negative.   Objective:  Physical Exam BP 103/71   Pulse 67   Ht 5\' 3"  (1.6 m)   Wt 164 lb 12.8 oz (74.8 kg)   LMP 12/10/2023 (Approximate)   BMI 29.19 kg/m    Physical Exam Vitals and nursing note reviewed.   Constitutional:      Appearance: Normal appearance.  HENT:     Head: Normocephalic and atraumatic.  Pulmonary:     Effort: Pulmonary effort is normal.  Skin:    General: Skin is warm and dry.  Neurological:     General: No focal deficit present.     Mental Status: She is alert.  Psychiatric:        Mood and Affect: Mood normal.        Behavior: Behavior normal.        Thought Content: Thought content normal.        Judgment: Judgment normal.       Assessment & Plan:   1. Sterilization consult (Primary) She desires permanent sterilization. Discussed alternatives including LARC options and vasectomy. She declines these options. Discussed surgery of salpingectomy vs tubal ligation. She would like to do a salpingectomy.  Risks of surgery include but are not limited to: bleeding, infection, injury to surrounding organs/tissues (i.e. bowel/bladder/ureters), need for additional procedures, wound complications, hospital re-admission, regret,  conversion to open surgery, and VTE.  Reviewed restrictions and recovery following surgery  She acknowledges the risks associated with the surgery, including potential injury to pelvic organs, infection, and the need for additional procedures if complications arise. She is agreeable to receiving a blood transfusion if necessary and consents to resuscitation measures such as chest compressions or intubation if required during the procedure.  She understands that the procedure  will not affect her menstrual cycle or hormonal status. She is aware that the surgery is a one-day procedure and that someone will need to drive her home post-surgery. - Ambulatory Referral For Surgery Scheduling  Routine preventative health maintenance measures emphasized.  Kiki Pelton, MD Minimally Invasive Gynecologic [facility] for [facility], [facility]

## 2024-01-01 ENCOUNTER — Telehealth: Payer: Self-pay

## 2024-01-01 NOTE — Telephone Encounter (Signed)
 I called patient to schedule surgery w/ Dr. Elester Grim on 02/08/24 at [facility] at 10:30 am. Patient states she's available and will arrive at 8:30 am for pre-op. I provided surgery details and pre-op instructions over the phone.

## 2024-01-05 ENCOUNTER — Telehealth: Payer: Self-pay

## 2024-01-05 NOTE — Telephone Encounter (Signed)
 I called patient back to address her Medicaid consent form. It appears the patient over corrected the "A" in her name, which could lead to Medicaid denying pymt of services for 02/08/24. Patient agreed to stop by the office on 01/06/24 to resign the form.

## 2024-01-05 NOTE — Telephone Encounter (Signed)
 Patient called to notify me that she is stopping by her doctor's office today at 1 pm to sign a new medicaid consent form. I confirmed that I would notify the team.

## 2024-01-27 ENCOUNTER — Ambulatory Visit: Admitting: Nurse Practitioner

## 2024-02-02 ENCOUNTER — Encounter (HOSPITAL_COMMUNITY): Payer: Self-pay | Admitting: Obstetrics and Gynecology

## 2024-02-02 NOTE — Progress Notes (Addendum)
 Spoke w/ via phone for pre-op interview--- [employer]----   UPT per anesthesia. Surgeon orders requested 02/01/24.      Lab results------ COVID test -----patient states asymptomatic no test needed Arrive at -------0900 NPO after MN NO Solid Food.  Clear liquids from MN until---0800 Pre-Surgery Ensure or G2:  Med rec completed Medications to take morning of surgery -----NONE Diabetic medication -----  GLP1 agonist last dose: GLP1 instructions:  Patient instructed no nail polish to be worn day of surgery Patient instructed to bring photo id and insurance card day of surgery Patient aware to have Driver (ride ) / caregiver    for 24 hours after surgery - Husband Wanda Norton Patient Special Instructions ----- Shower with antibacterial soap. Pre-Op special Instructions ----- Spoke with patient via telephone, speaks english very well, does not need interpreter.  Patient verbalized understanding of instructions that were given at this phone interview. Patient denies chest pain, sob, fever, cough at the interview.

## 2024-02-05 ENCOUNTER — Ambulatory Visit: Admitting: Nurse Practitioner

## 2024-02-05 NOTE — Progress Notes (Signed)
 Called pt regarding arrival time change. Pt to arrive at 0800, reviewed NPO guidelines with clear liquids until 0700. Pt verbalized understanding and denies questions at this time.

## 2024-02-07 NOTE — Anesthesia Preprocedure Evaluation (Signed)
 Anesthesia Evaluation  Patient identified by MRN, date of birth, ID band Patient awake    Reviewed: Allergy & Precautions, NPO status , Patient's Chart, lab work & pertinent test results  History of Anesthesia Complications Negative for: history of anesthetic complications  Airway Mallampati: II  TM Distance: >3 FB Neck ROM: Full    Dental no notable dental hx.    Pulmonary neg pulmonary ROS   Pulmonary exam normal        Cardiovascular negative cardio ROS Normal cardiovascular exam     Neuro/Psych  Headaches    GI/Hepatic negative GI ROS, Neg liver ROS,,,  Endo/Other  negative endocrine ROS    Renal/GU negative Renal ROS  negative genitourinary   Musculoskeletal negative musculoskeletal ROS (+)    Abdominal   Peds  Hematology negative hematology ROS (+)   Anesthesia Other Findings Day of surgery medications reviewed with patient.  Reproductive/Obstetrics Undesired fertility                              Anesthesia Physical Anesthesia Plan  ASA: 1  Anesthesia Plan: General   Post-op Pain Management: Tylenol  PO (pre-op)* and Toradol  IV (intra-op)*   Induction: Intravenous  PONV Risk Score and Plan: 3 and Treatment may vary due to age or medical condition, Ondansetron , Dexamethasone, Midazolam, Propofol infusion, TIVA and Scopolamine patch - Pre-op  Airway Management Planned: Oral ETT  Additional Equipment: None  Intra-op Plan:   Post-operative Plan: Extubation in OR  Informed Consent: I have reviewed the patients History and Physical, chart, labs and discussed the procedure including the risks, benefits and alternatives for the proposed anesthesia with the patient or authorized representative who has indicated his/her understanding and acceptance.     Dental advisory given  Plan Discussed with: CRNA  Anesthesia Plan Comments:         Anesthesia Quick  Evaluation

## 2024-02-08 ENCOUNTER — Other Ambulatory Visit (HOSPITAL_COMMUNITY): Payer: Self-pay

## 2024-02-08 ENCOUNTER — Encounter (HOSPITAL_COMMUNITY)
Admission: RE | Disposition: A | Discharge status: Home / Self Care | Payer: Self-pay | Source: Home / Self Care | Attending: Obstetrics and Gynecology

## 2024-02-08 ENCOUNTER — Other Ambulatory Visit: Payer: Self-pay

## 2024-02-08 ENCOUNTER — Ambulatory Visit (HOSPITAL_COMMUNITY): Payer: Self-pay | Admitting: Anesthesiology

## 2024-02-08 ENCOUNTER — Encounter (HOSPITAL_COMMUNITY): Payer: Self-pay | Admitting: Obstetrics and Gynecology

## 2024-02-08 ENCOUNTER — Ambulatory Visit (HOSPITAL_COMMUNITY)
Admission: RE | Admit: 2024-02-08 | Discharge: 2024-02-08 | Disposition: A | Discharge status: Home / Self Care | Attending: Obstetrics and Gynecology | Admitting: Obstetrics and Gynecology

## 2024-02-08 DIAGNOSIS — Z302 Encounter for sterilization: Secondary | ICD-10-CM | POA: Diagnosis not present

## 2024-02-08 DIAGNOSIS — Z01818 Encounter for other preprocedural examination: Secondary | ICD-10-CM

## 2024-02-08 DIAGNOSIS — S39012A Strain of muscle, fascia and tendon of lower back, initial encounter: Secondary | ICD-10-CM

## 2024-02-08 HISTORY — PX: LAPAROSCOPIC BILATERAL SALPINGECTOMY: SHX5889

## 2024-02-08 LAB — TYPE AND SCREEN
ABO/RH(D): O POS
Antibody Screen: NEGATIVE

## 2024-02-08 LAB — POCT PREGNANCY, URINE: Preg Test, Ur: NEGATIVE

## 2024-02-08 SURGERY — SALPINGECTOMY, BILATERAL, LAPAROSCOPIC
Anesthesia: General | Site: Pelvis | Laterality: Bilateral

## 2024-02-08 MED ORDER — PHENYLEPHRINE 80 MCG/ML (10ML) SYRINGE FOR IV PUSH (FOR BLOOD PRESSURE SUPPORT)
PREFILLED_SYRINGE | INTRAVENOUS | Status: DC | PRN
  Administered 2024-02-08: 80 ug via INTRAVENOUS

## 2024-02-08 MED ORDER — CHLORHEXIDINE GLUCONATE 0.12 % MT SOLN
15.0000 mL | Freq: Once | OROMUCOSAL | Status: AC
  Administered 2024-02-08: 15 mL via OROMUCOSAL

## 2024-02-08 MED ORDER — ROCURONIUM BROMIDE 10 MG/ML (PF) SYRINGE
PREFILLED_SYRINGE | INTRAVENOUS | Status: DC | PRN
  Administered 2024-02-08: 50 mg via INTRAVENOUS

## 2024-02-08 MED ORDER — FENTANYL CITRATE (PF) 250 MCG/5ML IJ SOLN
INTRAMUSCULAR | Status: AC
  Filled 2024-02-08: qty 5

## 2024-02-08 MED ORDER — OXYCODONE HCL 5 MG PO TABS
5.0000 mg | ORAL_TABLET | ORAL | 0 refills | Status: DC | PRN
  Filled 2024-02-08: qty 8, 2d supply, fill #0

## 2024-02-08 MED ORDER — SCOPOLAMINE 1 MG/3DAYS TD PT72
MEDICATED_PATCH | TRANSDERMAL | Status: DC
Start: 2024-02-08 — End: 2024-02-08
  Filled 2024-02-08: qty 1

## 2024-02-08 MED ORDER — ACETAMINOPHEN 500 MG PO TABS
1000.0000 mg | ORAL_TABLET | ORAL | Status: AC
  Administered 2024-02-08: 1000 mg via ORAL

## 2024-02-08 MED ORDER — DEXAMETHASONE SODIUM PHOSPHATE 10 MG/ML IJ SOLN
INTRAMUSCULAR | Status: DC | PRN
  Administered 2024-02-08: 5 mg via INTRAVENOUS

## 2024-02-08 MED ORDER — LIDOCAINE 2% (20 MG/ML) 5 ML SYRINGE
INTRAMUSCULAR | Status: AC
  Filled 2024-02-08: qty 5

## 2024-02-08 MED ORDER — KETOROLAC TROMETHAMINE 30 MG/ML IJ SOLN
INTRAMUSCULAR | Status: DC | PRN
Start: 2024-02-08 — End: 2024-02-08
  Administered 2024-02-08: 30 mg via INTRAVENOUS

## 2024-02-08 MED ORDER — FENTANYL CITRATE (PF) 250 MCG/5ML IJ SOLN
INTRAMUSCULAR | Status: DC | PRN
Start: 2024-02-08 — End: 2024-02-08
  Administered 2024-02-08: 100 ug via INTRAVENOUS
  Administered 2024-02-08: 50 ug via INTRAVENOUS

## 2024-02-08 MED ORDER — IBUPROFEN 800 MG PO TABS
800.0000 mg | ORAL_TABLET | Freq: Three times a day (TID) | ORAL | 0 refills | Status: AC | PRN
  Filled 2024-02-08: qty 60, 20d supply, fill #0

## 2024-02-08 MED ORDER — ONDANSETRON HCL 4 MG/2ML IJ SOLN
INTRAMUSCULAR | Status: AC
  Filled 2024-02-08: qty 2

## 2024-02-08 MED ORDER — MIDAZOLAM HCL 2 MG/2ML IJ SOLN
INTRAMUSCULAR | Status: AC
  Filled 2024-02-08: qty 2

## 2024-02-08 MED ORDER — 0.9 % SODIUM CHLORIDE (POUR BTL) OPTIME
TOPICAL | Status: DC | PRN
Start: 2024-02-08 — End: 2024-02-08
  Administered 2024-02-08: 1000 mL

## 2024-02-08 MED ORDER — DEXAMETHASONE SODIUM PHOSPHATE 10 MG/ML IJ SOLN
INTRAMUSCULAR | Status: AC
  Filled 2024-02-08: qty 1

## 2024-02-08 MED ORDER — LACTATED RINGERS IV SOLN
INTRAVENOUS | Status: DC
Start: 2024-02-08 — End: 2024-02-08

## 2024-02-08 MED ORDER — ROCURONIUM BROMIDE 10 MG/ML (PF) SYRINGE
PREFILLED_SYRINGE | INTRAVENOUS | Status: AC
  Filled 2024-02-08: qty 10

## 2024-02-08 MED ORDER — SUGAMMADEX SODIUM 200 MG/2ML IV SOLN
INTRAVENOUS | Status: DC | PRN
  Administered 2024-02-08: 200 mg via INTRAVENOUS

## 2024-02-08 MED ORDER — PROPOFOL 10 MG/ML IV BOLUS
INTRAVENOUS | Status: AC
  Filled 2024-02-08: qty 20

## 2024-02-08 MED ORDER — ONDANSETRON HCL 4 MG/2ML IJ SOLN
INTRAMUSCULAR | Status: DC | PRN
  Administered 2024-02-08: 4 mg via INTRAVENOUS

## 2024-02-08 MED ORDER — PROPOFOL 10 MG/ML IV BOLUS
INTRAVENOUS | Status: DC | PRN
  Administered 2024-02-08: 150 mg via INTRAVENOUS

## 2024-02-08 MED ORDER — ORAL CARE MOUTH RINSE: 15.0000 mL | Freq: Once | OROMUCOSAL | Status: AC

## 2024-02-08 MED ORDER — ACETAMINOPHEN 500 MG PO TABS
ORAL_TABLET | ORAL | Status: AC
  Filled 2024-02-08: qty 2

## 2024-02-08 MED ORDER — ACETAMINOPHEN 325 MG PO TABS: 650.0000 mg | ORAL_TABLET | ORAL | Status: AC | PRN

## 2024-02-08 MED ORDER — FENTANYL CITRATE (PF) 100 MCG/2ML IJ SOLN: 25.0000 ug | INTRAMUSCULAR | Status: DC | PRN

## 2024-02-08 MED ORDER — LACTATED RINGERS IV SOLN: INTRAVENOUS | Status: DC

## 2024-02-08 MED ORDER — SCOPOLAMINE 1 MG/3DAYS TD PT72
1.0000 | MEDICATED_PATCH | Freq: Once | TRANSDERMAL | Status: DC
  Administered 2024-02-08: 1.5 mg via TRANSDERMAL

## 2024-02-08 MED ORDER — DROPERIDOL 2.5 MG/ML IJ SOLN: 0.6250 mg | Freq: Once | INTRAMUSCULAR | Status: DC | PRN

## 2024-02-08 MED ORDER — OXYCODONE HCL 5 MG/5ML PO SOLN: 5.0000 mg | Freq: Once | ORAL | Status: DC | PRN

## 2024-02-08 MED ORDER — OXYCODONE HCL 5 MG PO TABS: 5.0000 mg | ORAL_TABLET | Freq: Once | ORAL | Status: DC | PRN

## 2024-02-08 MED ORDER — BUPIVACAINE HCL (PF) 0.5 % IJ SOLN
INTRAMUSCULAR | Status: DC | PRN
  Administered 2024-02-08: 10 mL

## 2024-02-08 MED ORDER — PROPOFOL 500 MG/50ML IV EMUL
INTRAVENOUS | Status: DC | PRN
Start: 2024-02-08 — End: 2024-02-08
  Administered 2024-02-08: 125 ug/kg/min via INTRAVENOUS

## 2024-02-08 MED ORDER — KETOROLAC TROMETHAMINE 30 MG/ML IJ SOLN
INTRAMUSCULAR | Status: AC
  Filled 2024-02-08: qty 1

## 2024-02-08 MED ORDER — LIDOCAINE 2% (20 MG/ML) 5 ML SYRINGE
INTRAMUSCULAR | Status: DC | PRN
  Administered 2024-02-08: 100 mg via INTRAVENOUS

## 2024-02-08 MED ORDER — MIDAZOLAM HCL 2 MG/2ML IJ SOLN
INTRAMUSCULAR | Status: AC
Start: 2024-02-08 — End: 2024-02-08
  Filled 2024-02-08: qty 2

## 2024-02-08 MED ORDER — CHLORHEXIDINE GLUCONATE 0.12 % MT SOLN
OROMUCOSAL | Status: AC
  Filled 2024-02-08: qty 15

## 2024-02-08 SURGICAL SUPPLY — 36 items
APPLICATOR ARISTA FLEXITIP XL (MISCELLANEOUS) IMPLANT
CNTNR URN SCR LID CUP LEK RST (MISCELLANEOUS) ×1 IMPLANT
COVER MAYO STAND STRL (DRAPES) ×1 IMPLANT
DERMABOND ADVANCED .7 DNX12 (GAUZE/BANDAGES/DRESSINGS) ×1 IMPLANT
DRAPE SURG IRRIG POUCH 19X23 (DRAPES) ×1 IMPLANT
DRAPE TOWEL STERILE LF 18X12 (DRAPES) ×1 IMPLANT
DURAPREP 26ML APPLICATOR (WOUND CARE) ×1 IMPLANT
ELECTRODE REM PT RTRN 9FT ADLT (ELECTROSURGICAL) ×1 IMPLANT
GAUZE 4X4 16PLY ~~LOC~~+RFID DBL (SPONGE) IMPLANT
GLOVE BIOGEL PI IND STRL 7.0 (GLOVE) ×4 IMPLANT
GLOVE ECLIPSE 7.0 STRL STRAW (GLOVE) ×1 IMPLANT
GLOVE INDICATOR 7.5 STRL GRN (GLOVE) IMPLANT
GOWN STRL REUS W/ TWL LRG LVL3 (GOWN DISPOSABLE) ×1 IMPLANT
GOWN STRL REUS W/ TWL XL LVL3 (GOWN DISPOSABLE) ×2 IMPLANT
IRRIGATION SUCT STRKRFLW 2 WTP (MISCELLANEOUS) IMPLANT
KIT PINK PAD W/HEAD ARM REST (MISCELLANEOUS) ×1 IMPLANT
KIT TURNOVER KIT B (KITS) ×1 IMPLANT
LIGASURE BLUNT TIP 5 LONG 44CM (ELECTROSURGICAL) IMPLANT
MANIFOLD NEPTUNE II (INSTRUMENTS) IMPLANT
NDL INSUFFLATION 14GA 120MM (NEEDLE) IMPLANT
NDL SPNL 22GX3.5 QUINCKE BK (NEEDLE) ×1 IMPLANT
NEEDLE INSUFFLATION 14GA 120MM (NEEDLE) IMPLANT
NEEDLE SPNL 22GX3.5 QUINCKE BK (NEEDLE) ×1 IMPLANT
NS IRRIG 1000ML POUR BTL (IV SOLUTION) ×1 IMPLANT
PACK LAPAROSCOPY BASIN (CUSTOM PROCEDURE TRAY) ×1 IMPLANT
PAD OB MATERNITY 11 LF (PERSONAL CARE ITEMS) ×1 IMPLANT
POUCH LAPAROSCOPIC INSTRUMENT (MISCELLANEOUS) ×1 IMPLANT
SET TUBE SMOKE EVAC HIGH FLOW (TUBING) ×1 IMPLANT
SHEARS HARMONIC 36 ACE (MISCELLANEOUS) IMPLANT
SLEEVE SCD COMPRESS KNEE MED (STOCKING) ×1 IMPLANT
SLEEVE Z-THREAD 5X100MM (TROCAR) ×2 IMPLANT
SUT VIC AB 4-0 PS2 18 (SUTURE) ×1 IMPLANT
SYSTEM BAG RETRIEVAL 10MM (BASKET) IMPLANT
TRAY FOLEY W/BAG SLVR 14FR LF (SET/KITS/TRAYS/PACK) ×1 IMPLANT
TROCAR Z-THREAD FIOS 5X100MM (TROCAR) ×1 IMPLANT
WARMER LAPAROSCOPE (MISCELLANEOUS) ×1 IMPLANT

## 2024-02-08 NOTE — Anesthesia Postprocedure Evaluation (Signed)
 Anesthesia Post Note  Patient: Wanda Norton  Procedure(s) Performed: SALPINGECTOMY, BILATERAL, LAPAROSCOPIC (Bilateral: Pelvis)     Patient location during evaluation: PACU Anesthesia Type: General Level of consciousness: awake and alert Pain management: pain level controlled Vital Signs Assessment: post-procedure vital signs reviewed and stable Respiratory status: spontaneous breathing, nonlabored ventilation and respiratory function stable Cardiovascular status: blood pressure returned to baseline Postop Assessment: no apparent nausea or vomiting Anesthetic complications: no   There were no known notable events for this encounter.  Last Vitals:  Vitals:   02/08/24 1151 02/08/24 1220  BP: 111/74   Pulse: 89 76  Resp: 14 13  Temp: 36.7 C   SpO2: 99% 100%    Last Pain:  Vitals:   02/08/24 0834  TempSrc: Oral  PainSc: 0-No pain                 Vertell Row

## 2024-02-08 NOTE — Anesthesia Procedure Notes (Signed)
 Procedure Name: Intubation Date/Time: 02/08/2024 10:43 AM  Performed by: Nuria Phebus C, CRNAPre-anesthesia Checklist: Patient identified, Emergency Drugs available, Suction available and Patient being monitored Patient Re-evaluated:Patient Re-evaluated prior to induction Oxygen Delivery Method: Circle System Utilized Preoxygenation: Pre-oxygenation with 100% oxygen Induction Type: IV induction and Cricoid Pressure applied Ventilation: Mask ventilation without difficulty and Oral airway inserted - appropriate to patient size Laryngoscope Size: Mac and 3 Grade View: Grade II Tube type: Oral Tube size: 7.0 mm Number of attempts: 1 Airway Equipment and Method: Stylet and Oral airway Placement Confirmation: ETT inserted through vocal cords under direct vision, positive ETCO2 and breath sounds checked- equal and bilateral Secured at: 22 cm Tube secured with: Tape Dental Injury: Teeth and Oropharynx as per pre-operative assessment and Injury to lip  Comments: Intubated by Lauraine Rana, SRNA

## 2024-02-08 NOTE — Brief Op Note (Signed)
 02/08/2024  11:21 AM  PATIENT:  Wanda Norton  38 y.o. female  PRE-OPERATIVE DIAGNOSIS:  Undesired fertility  POST-OPERATIVE DIAGNOSIS:  Undesired fertility  PROCEDURE:  Procedure(s): SALPINGECTOMY, BILATERAL, LAPAROSCOPIC (Bilateral)  SURGEON:  Surgeons and Role:    DEWAINE Jeralyn Crutch, MD - Primary  PHYSICIAN ASSISTANT: Jorene Moats, PA  ASSISTANTS: above   ANESTHESIA:   local and general  EBL:  5 mL   BLOOD ADMINISTERED:none  DRAINS: Urinary Catheter (Foley) removed at end of case  LOCAL MEDICATIONS USED:  BUPIVICAINE   SPECIMEN:  Source of Specimen:  bilateral fallopian tubes  DISPOSITION OF SPECIMEN:  PATHOLOGY  COUNTS:  YES  TOURNIQUET:  * No tourniquets in log *  DICTATION: .Note written in EPIC  PLAN OF CARE: Discharge to home after PACU  PATIENT DISPOSITION:  PACU - hemodynamically stable.   Delay start of Pharmacological VTE agent (>24hrs) due to surgical blood loss or risk of bleeding: not applicable

## 2024-02-08 NOTE — Op Note (Signed)
 [place] PROCEDURE DATE: 02/08/2024  PREOPERATIVE DIAGNOSIS: unwanted fertility  POSTOPERATIVE DIAGNOSIS: unwanted fertility PROCEDURE:    laparoscopic bilateral salpingectomy SURGEON: Rimsha Trembley, MD ASSISTANT:  Jorene Moats, PA  An experienced assistant was required given the standard of surgical care given the complexity of the case.  This assistant was needed for exposure, dissection, suctioning, retraction, instrument exchange, and for overall help during the procedure.  INDICATIONS: 38 y.o. [id number] with unwanted fertility.  Risks of surgery were discussed with the patient including but not limited to: bleeding which may require transfusion; infection which may require antibiotics; injury to surrounding organs; need for additional procedures including laparotomy;  and other postoperative/anesthesia complications. Written informed consent was obtained.    FINDINGS:  Normal external genitalia, normal appearing cervix Laparoscopically: normal upper abdominal survey, normal appearing uterus, normal bilateral fallopian tubes, normal bilateral ovaries,  normal anterior cul de sac, normal posterior cul de sac  ANESTHESIA: General ESTIMATED BLOOD LOSS:  10 ml URINE OUTPUT: 150 ml SPECIMENS:  bilateral fallopian tubes COMPLICATIONS:  None immediate.  PROCEDURE:  The patient was taken to the operating room where general anesthesia was obtained without difficulty.  She was then placed in the dorsal lithotomy position and prepared and draped in sterile fashion.  After an adequate timeout was performed, a foley cathter was placed in sterile fashion and drained yellow urine for the duration of the case. A bivalved speculum was then placed in the patient's vagina, and the anterior lip of cervix grasped with the single-tooth tenaculum.  The uterine manipulator was then advanced into the uterus.  The speculum was removed from the vagina.  Attention was then turned to the patient's abdomen where a  5-mm skin incision was made in the umbilical fold.  The 5-mm trocar and sleeve were then advanced without difficulty with the laparoscope under direct visualization into the abdomen.  The abdomen was then insufflated with carbon dioxide gas.  Adequate pneumoperitoneum was obtained.  A survey of the patient's pelvis and abdomen revealed the findings above.  Bilateral 5-mm lower quadrant ports  were then placed under direct visualization.  The fallopian tubes were transected from the uterine attachments and the underlying mesosalpinx with the Harmonic device allowing for bilateral salpingectomy.  The fallopian tubes were then removed from the abdomen under direct visualization.  The operative site was surveyed, and it was found to be hemostatic.   No intraoperative injury to other surrounding organs was noted.  The abdomen was desufflated and all instruments were then removed from the patient's abdomen.  All skin incisions were closed with 4-0 vicryl. They were also covered with dermabond.  The uterine manipulator was removed from the vagina without complications as well as the foley from the bladder. The patient tolerated the procedure well.  Sponge, lap, and needle counts were correct times two.  The patient was then taken to the recovery room awake, extubated and in stable condition.   Carter Quarry, MD Minimally Invasive Gynecologic Surgery  Obstetrics and Gynecology, [facility], [facility] 02/08/2024

## 2024-02-08 NOTE — Discharge Instructions (Addendum)
 Post-surgical Instructions, Outpatient Surgery  You may expect to feel dizzy, weak, and drowsy for as long as 24 hours after receiving the medicine that made you sleep (anesthetic). For the first 24 hours after your surgery:   Do not drive a car, ride a bicycle, participate in physical activities, or take public transportation until you are done taking narcotic pain medicines or as directed by Dr. Jeralyn.  Do not drink alcohol or take tranquilizers.  Do not take medicine that has not been prescribed by your physicians.  Do not sign important papers or make important decisions while on narcotic pain medicines.  Have a responsible person with you.   CARE OF INCISION If you have a bandage, you may remove it in one day.  If there are steri-strips or dermabond, just let this loosen on its own.  You may shower on the first day after your surgery.  Do not sit in a tub bath for one week. Avoid heavy lifting (more than 10 pounds/4.5 kilograms), pushing, or pulling.  Avoid activities that may risk injury to your incisions.   PAIN MANAGEMENT Ibuprofen  800mg .  (This is the same as 4-200mg  over the counter tablets of Motrin  or ibuprofen .)  Take this every 6 hours or as needed for cramping.   Acetaminophen  1000mg  (This is the same as 2-500mg  over the counter extra strength tylenol ). Take this every 6 hours for the first 3 days or as needed afterwards for pain Oxycodone  5mg  For more severe pain, take one or two tablets every four to six hours as needed for pain control.  (Remember that narcotic pain medications increase your risk of constipation.  If this becomes a problem, you may take an over the counter laxative like miralax.)  DO'S AND DON'T'S Do not take a tub bath for 2 weeks.  You may shower on the first day after your surgery Do not do any heavy lifting for one to two weeks.  This increases the chance of bleeding. Do move around as you feel able.  Stairs are fine.  You may begin to exercise again as  you feel able.  Do not lift any weights for two weeks. Do not put anything in the vagina for two weeks--no tampons, intercourse, or douching.    REGULAR MEDIATIONS/VITAMINS: You may restart all of your regular medications as prescribed. You may restart all of your vitamins as you normally take them.    PLEASE CALL OR SEEK MEDICAL CARE IF: You have persistent nausea and vomiting.  You have trouble eating or drinking.  You have an oral temperature above 100.5.  You have constipation that is not helped by adjusting diet or increasing fluid intake. Pain medicines are a common cause of constipation.  You have heavy vaginal bleeding You have redness or drainage from your incision(s) or there is increasing pain or tenderness near or in the surgical site.    Post Anesthesia Home Care Instructions  Activity: Get plenty of rest for the remainder of the day. A responsible adult should stay with you for 24 hours following the procedure.  For the next 24 hours, DO NOT: -Drive a car -Advertising copywriter -Drink alcoholic beverages -Take any medication unless instructed by your physician -Make any legal decisions or sign important papers.  Meals: Start with liquid foods such as gelatin or soup. Progress to regular foods as tolerated. Avoid greasy, spicy, heavy foods. If nausea and/or vomiting occur, drink only clear liquids until the nausea and/or vomiting subsides. Call your physician if  vomiting continues.  Special Instructions/Symptoms: Your throat may feel dry or sore from the anesthesia or the breathing tube placed in your throat during surgery. If this causes discomfort, gargle with warm salt water. The discomfort should disappear within 24 hours.  If you had a scopolamine patch placed behind your ear for the management of post- operative nausea and/or vomiting:  1. The medication in the patch is effective for 72 hours, after which it should be removed.  Wrap patch in a tissue and discard in  the trash. Wash hands thoroughly with soap and water. 2. You may remove the patch earlier than 72 hours if you experience unpleasant side effects which may include dry mouth, dizziness or visual disturbances. 3. Avoid touching the patch. Wash your hands with soap and water after contact with the patch.

## 2024-02-08 NOTE — H&P (Signed)
 OB/GYN Pre-Op History and Physical  Wanda Norton is a 38 y.o. [id number] presenting for laparoscopic bilateral salpingectomy for permanent sterilization.       Past Medical History:  Diagnosis Date   Allergy    Headache    Upper respiratory tract infection 08/26/2023   Vitamin D  deficiency     Past Surgical History:  Procedure Laterality Date   NO PAST SURGERIES      OB History  Gravida Para Term Preterm AB Living  5 4 4  0 1 4  SAB IAB Ectopic Multiple Live Births  1 0 0 0 4    # Outcome Date GA Lbr Len/2nd Weight Sex Type Anes PTL Lv  5 Term 01/01/21 [redacted]w[redacted]d 01:29 / 01:43 4346 g F Vag-Spont None  LIV     Birth Comments: wnl  4 Term 09/23/16 109w1d 03:43 / 05:46 3881 g F Vag-Spont EPI  LIV  3 SAB           2 Term      Vag-Spont     1 Term      Vag-Spont       Social History   Socioeconomic History   Marital status: Married    Spouse name: Not on file   Number of children: Not on file   Years of education: Not on file   Highest education level: Not on file  Occupational History   Not on file  Tobacco Use   Smoking status: Never   Smokeless tobacco: Never  Vaping Use   Vaping status: Never Used  Substance and Sexual Activity   Alcohol use: No   Drug use: No   Sexual activity: Yes    Birth control/protection: None    Comment: Wants to get a tubal  Other Topics Concern   Not on file  Social History Narrative   Not on file   Social Drivers of Health   Financial Resource Strain: Not on file  Food Insecurity: Not on file  Transportation Needs: Not on file  Physical Activity: Not on file  Stress: Not on file  Social Connections: Not on file    Family History  Problem Relation Age of Onset   Alcohol abuse Neg Hx    Arthritis Neg Hx    Asthma Neg Hx    Birth defects Neg Hx    Cancer Neg Hx    COPD Neg Hx    Depression Neg Hx    Diabetes Neg Hx    Drug abuse Neg Hx    Early death Neg Hx    Hearing loss Neg Hx    Heart disease Neg Hx     Hyperlipidemia Neg Hx    Hypertension Neg Hx    Kidney disease Neg Hx    Learning disabilities Neg Hx    Mental illness Neg Hx    Mental retardation Neg Hx    Miscarriages / Stillbirths Neg Hx    Stroke Neg Hx    Vision loss Neg Hx    Varicose Veins Neg Hx    Allergic rhinitis Neg Hx    Eczema Neg Hx    Urticaria Neg Hx     Medications Prior to Admission  Medication Sig Dispense Refill Last Dose/Taking   loratadine  (CLARITIN ) 10 MG tablet Take 1 tablet (10 mg total) by mouth daily. 90 tablet 3 Past Week   acetaminophen  (TYLENOL ) 325 MG tablet Take 2 tablets (650 mg total) by mouth every 4 (four) hours as needed (for pain scale <  4).   Unknown   Bismuth  Subsalicylate 525 MG TABS Take 1 tablet by mouth 4 (four) times daily. (Patient not taking: Reported on 12/22/2023) 56 tablet 0    fluticasone  (FLONASE ) 50 MCG/ACT nasal spray Place 2 sprays into both nostrils in the morning and at bedtime. (Patient not taking: Reported on 02/02/2024) 16 g 6 Not Taking   ibuprofen  (ADVIL ) 800 MG tablet Take 1 tablet (800 mg total) by mouth every 8 (eight) hours as needed (pain). Take with food. 30 tablet 0 Unknown   lidocaine  (LIDODERM ) 5 % Place 1 patch onto the skin daily. Remove & Discard patch within 12 hours or as directed by MD (Patient not taking: Reported on 08/26/2023) 12 patch 0    metroNIDAZOLE  (FLAGYL ) 250 MG tablet Take 1 tablet (250 mg total) by mouth 4 (four) times daily. (Patient not taking: Reported on 12/22/2023) 56 tablet 0    pantoprazole  (PROTONIX ) 40 MG tablet Take 1 tablet (40 mg total) by mouth 2 (two) times daily. (Patient not taking: Reported on 12/22/2023) 28 tablet 0    tetracycline  (SUMYCIN ) 500 MG capsule Take 1 capsule (500 mg total) by mouth 4 (four) times daily. (Patient not taking: Reported on 12/22/2023) 56 capsule 0     No Known Allergies  Review of Systems: Negative except for what is mentioned in HPI.     Physical Exam: BP 105/74   Pulse 72   Temp 98.1 F (36.7 C)  (Oral)   Resp 17   Ht 5' 3 (1.6 m)   Wt 75.8 kg   LMP 02/01/2024 (Exact Date)   SpO2 100%   BMI 29.58 kg/m  CONSTITUTIONAL: Well-developed, well-nourished and in no acute distress.  HENT:  Normocephalic, atraumatic, External right and left ear normal. Oropharynx is clear and moist EYES: Conjunctivae and EOM are normal. Pupils are equal, round, and reactive to light. No scleral icterus.  NECK: Normal range of motion, supple, no masses SKIN: Skin is warm and dry. No rash noted. Not diaphoretic. No erythema. No pallor. NEUROLGIC: Alert and oriented to person, place, and time. Normal reflexes, muscle tone coordination. No cranial nerve deficit noted. PSYCHIATRIC: Normal mood and affect. Normal behavior. Normal judgment and thought content. RESPIRATORY: Normal effort PELVIC: Deferred   Pertinent Labs/Studies:   Results for orders placed or performed during the hospital encounter of 02/08/24 (from the past 72 hours)  Pregnancy, urine POC     Status: None   Collection Time: 02/08/24  8:20 AM  Result Value Ref Range   Preg Test, Ur NEGATIVE NEGATIVE    Comment:        THE SENSITIVITY OF THIS METHODOLOGY IS >20 mIU/mL.        Assessment and Plan :Wanda Norton is a 38 y.o. [id number] here for permanent sterilization.   Patient desires surgical management with undesired fertility.  The risks of surgery were discussed in detail with the patient including but not limited to: bleeding which may require transfusion or reoperation; infection which may require prolonged hospitalization or re-hospitalization and antibiotic therapy; injury to bowel, bladder, ureters and major vessels or other surrounding organs which may lead to other procedures; formation of adhesions; need for additional procedures including laparotomy or subsequent procedures secondary to intraoperative injury or abnormal pathology; thromboembolic phenomenon; incisional problems and other postoperative or anesthesia complications.   Patient was told that the likelihood that her condition and symptoms will be treated effectively with this surgical management was high; the postoperative expectations were also discussed in detail. The  patient also understands the alternative treatment options which were discussed in full. All questions were answered.       Joshus Rogan, M.D. Minimally Invasive Gynecologic Surgery and Pelvic Pain Specialist Attending Obstetrician & Gynecologist, [facility] [employer], [facility]

## 2024-02-08 NOTE — Transfer of Care (Signed)
 Immediate Anesthesia Transfer of Care Note  Patient: Wanda Norton  Procedure(s) Performed: SALPINGECTOMY, BILATERAL, LAPAROSCOPIC (Bilateral: Pelvis)  Patient Location: PACU  Anesthesia Type:General  Level of Consciousness: awake and alert   Airway & Oxygen Therapy: Patient Spontanous Breathing and Patient connected to face mask  Post-op Assessment: Report given to RN and Post -op Vital signs reviewed and stable  Post vital signs: Reviewed and stable  Last Vitals:  Vitals Value Taken Time  BP 111/74 02/08/24 11:50  Temp    Pulse 85 02/08/24 11:53  Resp 20 02/08/24 11:53  SpO2 98 % 02/08/24 11:53  Vitals shown include unfiled device data.  Last Pain:  Vitals:   02/08/24 0834  TempSrc: Oral  PainSc: 0-No pain      Patients Stated Pain Goal: 6 (02/08/24 9165)  Complications: There were no known notable events for this encounter.

## 2024-02-09 ENCOUNTER — Encounter (HOSPITAL_COMMUNITY): Payer: Self-pay | Admitting: Obstetrics and Gynecology

## 2024-02-09 LAB — SURGICAL PATHOLOGY

## 2024-02-10 ENCOUNTER — Ambulatory Visit: Payer: Self-pay | Admitting: Obstetrics and Gynecology

## 2024-03-09 ENCOUNTER — Ambulatory Visit: Admitting: Obstetrics and Gynecology

## 2024-03-09 VITALS — BP 113/81 | HR 69 | Ht 63.0 in | Wt 166.1 lb

## 2024-03-09 DIAGNOSIS — R61 Generalized hyperhidrosis: Secondary | ICD-10-CM

## 2024-03-09 DIAGNOSIS — R232 Flushing: Secondary | ICD-10-CM

## 2024-03-09 DIAGNOSIS — Z09 Encounter for follow-up examination after completed treatment for conditions other than malignant neoplasm: Secondary | ICD-10-CM

## 2024-03-09 NOTE — Progress Notes (Signed)
   POSTOPERATIVE VISIT NOTE   Subjective:     Wanda Norton is a 38 y.o. [id number] who presents to the clinic 4 weeks status post bilateral salpingectomy for requested sterilization. Eating a regular diet without difficulty. Bowel movements are normal. The patient is not having any pain. Incision: healing well Vaginal bleeding: none Resumed sexual acitivity: yes  Menses have not resumed since surgery. She also reports having frequent hot flashes/sweating episodes and concerns about her hormones. Has also been having to cough when she wakes up before she can get up. No sore throat and does not continue throughout the day.   The following portions of the patient's history were reviewed and updated as appropriate: allergies, current medications, past family history, past medical history, past social history, past surgical history, and problem list..   Review of Systems Pertinent items are noted in HPI.    Objective:    BP 113/81   Pulse 69   Ht 5' 3 (1.6 m)   Wt 166 lb 1.6 oz (75.3 kg)   LMP 02/11/2024   BMI 29.42 kg/m  General:  alert, cooperative, and no distress  Abdomen: soft, non-tender  Incision:   healing well, no drainage, no erythema, no hernia, no seroma, no swelling, no dehiscence, incision well approximated  Pelvic:   Exam deferred.    Pathology Results: FINAL MICROSCOPIC DIAGNOSIS:   A. FALLOPIAN TUBES, BIALTERAL, SALPINGECTOMY:  - Complete cross-sections of bilateral benign fallopian tubes.    Assessment:   Doing well postoperatively. Operative findings again reviewed. Pathology report discussed.   Plan:    1. Postop check (Primary) Doing ok. Overall meeting postop goals. Unclear etiology of coughing  2. Abnormal flushing and sweating Discussed flushing may be secondary to initial decreased in blood supply to ovaries but should rebound. Labs obtained rule out thyroid  abnormalities.  - Thyroid  Panel With TSH - FSH  Activity restrictions: none Follow up: as  needed  Carter Quarry, MD Obstetrician & Gynecologist, [facility] [employer], [facility]

## 2024-03-09 NOTE — Progress Notes (Signed)
 Pt presents for post op. Pt states that after surgery she is having night sweats and when she goes to the bathroom she have cough

## 2024-03-10 ENCOUNTER — Ambulatory Visit: Payer: Self-pay | Admitting: Obstetrics and Gynecology

## 2024-03-10 LAB — THYROID PANEL WITH TSH
Free Thyroxine Index: 2.1 (ref 1.2–4.9)
T3 Uptake Ratio: 26 % (ref 24–39)
T4, Total: 7.9 ug/dL (ref 4.5–12.0)
TSH: 0.703 u[IU]/mL (ref 0.450–4.500)

## 2024-03-10 LAB — FOLLICLE STIMULATING HORMONE: FSH: 61.8 m[IU]/mL

## 2024-03-16 ENCOUNTER — Ambulatory Visit: Admitting: Nurse Practitioner

## 2024-03-16 ENCOUNTER — Encounter: Payer: Self-pay | Admitting: Nurse Practitioner

## 2024-03-16 VITALS — BP 110/70 | HR 87 | Temp 97.5°F | Ht 63.0 in | Wt 170.2 lb

## 2024-03-16 DIAGNOSIS — R232 Flushing: Secondary | ICD-10-CM | POA: Diagnosis not present

## 2024-03-16 DIAGNOSIS — L853 Xerosis cutis: Secondary | ICD-10-CM | POA: Diagnosis not present

## 2024-03-16 DIAGNOSIS — A048 Other specified bacterial intestinal infections: Secondary | ICD-10-CM | POA: Insufficient documentation

## 2024-03-16 NOTE — Progress Notes (Signed)
 Established Patient Office Visit  Subjective   Patient ID: Wanda Norton, female    DOB: 23-Mar-1986  Age: 38 y.o. MRN: [id number]  Chief Complaint  Patient presents with   Results    Discuss recent lab results and request to get H. Plylori test done.     HPI Discussed the use of AI scribe software for clinical note transcription with the patient, who gave verbal consent to proceed.  History of Present Illness   Wanda Norton is a 38 year old female who presents with menopausal symptoms following recent surgery.  She experiences hot flashes and insomnia following recent surgery for tube removal. Her FSH levels were checked and were elevated and TSH was normal. She describes her symptoms as severe and reports difficulty sleeping. She takes an allergy medication from [employer], which aids her sleep, and wishes to minimize medication use. She has a history of H. pylori infection and did not complete her previous treatment due to concerns about drug interactions. She denies current stomach pain. She seeks a dermatology referral for dandruff and dry itchy skin, as advised by a friend.       ROS See pertinent positives and negatives per HPI.    Objective:     BP 110/70 (BP Location: Left Arm, Patient Position: Sitting, Cuff Size: Normal)   Pulse 87   Temp (!) 97.5 F (36.4 C)   Ht 5' 3 (1.6 m)   Wt 170 lb 3.2 oz (77.2 kg)   LMP 01/12/2024 (Exact Date)   SpO2 96%   BMI 30.15 kg/m    Physical Exam Vitals and nursing note reviewed.  Constitutional:      General: She is not in acute distress.    Appearance: Normal appearance.  HENT:     Head: Normocephalic.  Eyes:     Conjunctiva/sclera: Conjunctivae normal.  Cardiovascular:     Rate and Rhythm: Normal rate and regular rhythm.     Pulses: Normal pulses.     Heart sounds: Normal heart sounds.  Pulmonary:     Effort: Pulmonary effort is normal.     Breath sounds: Normal breath sounds.  Abdominal:     Palpations: Abdomen is  soft.     Tenderness: There is no abdominal tenderness.  Musculoskeletal:     Cervical back: Normal range of motion.  Skin:    General: Skin is warm.  Neurological:     General: No focal deficit present.     Mental Status: She is alert and oriented to person, place, and time.  Psychiatric:        Mood and Affect: Mood normal.        Behavior: Behavior normal.        Thought Content: Thought content normal.        Judgment: Judgment normal.      Assessment & Plan:   Problem List Items Addressed This Visit       Cardiovascular and Mediastinum   Hot flashes - Primary   Post-surgical menopausal symptoms are likely due to temporary disruption of blood flow to the ovaries following tubal removal. Recommend over-the-counter Estroven or black cohosh for hot flashes and sweating.        Musculoskeletal and Integument   Dry skin   Continue diphenhydramine  as needed for dry itchy skin. Referral placed to dermatology.       Relevant Orders   Ambulatory referral to Dermatology     Other   H. pylori infection   Previous H. pylori  infection had incomplete antibiotic treatment due to concerns about medication interactions. No current stomach pain is reported. Order a test of cure for H. pylori infection. If H. pylori is still present, prescribe antibiotics. Allergy medication can be taken concurrently with antibiotics if needed.       Relevant Orders   H. pylori breath test     Return in about 4 months (around 07/16/2024) for CPE.    Tinnie DELENA Harada, NP

## 2024-03-16 NOTE — Assessment & Plan Note (Signed)
 Post-surgical menopausal symptoms are likely due to temporary disruption of blood flow to the ovaries following tubal removal. Recommend over-the-counter Estroven or black cohosh for hot flashes and sweating.

## 2024-03-16 NOTE — Assessment & Plan Note (Signed)
 Previous H. pylori infection had incomplete antibiotic treatment due to concerns about medication interactions. No current stomach pain is reported. Order a test of cure for H. pylori infection. If H. pylori is still present, prescribe antibiotics. Allergy medication can be taken concurrently with antibiotics if needed.

## 2024-03-16 NOTE — Assessment & Plan Note (Signed)
 Continue diphenhydramine  as needed for dry itchy skin. Referral placed to dermatology.

## 2024-03-16 NOTE — Patient Instructions (Addendum)
 It was great to see you!  You can take the allergy medication at bedtime to help with sleep and itching   We are checking your labs today and will let you know the results via mychart/phone.   I have placed a referral to dermatology - they will call to schedule   [facility]  [address] [place], [place], [place] [address] Phone: [phone]  Let's follow-up in 4 months, sooner if you have concerns.  If a referral was placed today, you will be contacted for an appointment. Please note that routine referrals can sometimes take up to 3-4 weeks to process. Please call our office if you haven't heard anything after this time frame.  Take care,  Tinnie Harada, NP

## 2024-03-17 ENCOUNTER — Ambulatory Visit: Payer: Self-pay | Admitting: Nurse Practitioner

## 2024-03-17 DIAGNOSIS — A048 Other specified bacterial intestinal infections: Secondary | ICD-10-CM

## 2024-03-17 LAB — H. PYLORI BREATH TEST: H. pylori Breath Test: DETECTED — AB

## 2024-03-17 MED ORDER — METRONIDAZOLE 500 MG PO TABS: 500.0000 mg | ORAL_TABLET | Freq: Four times a day (QID) | ORAL | 0 refills | Status: AC

## 2024-03-17 MED ORDER — BISMUTH SUBSALICYLATE 525 MG PO TABS: 1.0000 | ORAL_TABLET | Freq: Four times a day (QID) | ORAL | 0 refills | Status: DC

## 2024-03-17 MED ORDER — TETRACYCLINE HCL 500 MG PO CAPS: 500.0000 mg | ORAL_CAPSULE | Freq: Four times a day (QID) | ORAL | 0 refills | Status: DC

## 2024-03-17 MED ORDER — PANTOPRAZOLE SODIUM 40 MG PO TBEC: 40.0000 mg | DELAYED_RELEASE_TABLET | Freq: Two times a day (BID) | ORAL | 0 refills | Status: DC

## 2024-03-18 ENCOUNTER — Other Ambulatory Visit (HOSPITAL_COMMUNITY): Payer: Self-pay

## 2024-03-18 ENCOUNTER — Telehealth: Payer: Self-pay

## 2024-03-18 NOTE — Telephone Encounter (Signed)
 Pharmacy Patient Advocate Encounter   Received notification from Onbase that prior authorization for Tetracycline  HCl 500MG  capsules  is required/requested.   Insurance verification completed.   The patient is insured through [facility] .   Per test claim:  Doxycycline  Hyclate, Doxycycline  Mono or Minocycline is preferred by the insurance.  If suggested medication is appropriate, Please send in a new RX and discontinue this one. If not, please advise as to why it's not appropriate so that we may request a Prior Authorization. Please note, some preferred medications may still require a PA.  If the suggested medications have not been trialed and there are no contraindications to their use, the PA will not be submitted, as it will not be approved.

## 2024-03-18 NOTE — Telephone Encounter (Signed)
 Pharmacy Patient Advocate Encounter   Received notification from Onbase that prior authorization for Tetracycline  HCl 500MG  capsules  is required/requested.   Insurance verification completed.   The patient is insured through [facility] .   Per test claim: PA required; PA submitted to above mentioned insurance via CoverMyMeds Key/confirmation #/EOC [id number] Status is pending

## 2024-03-21 ENCOUNTER — Other Ambulatory Visit (HOSPITAL_COMMUNITY): Payer: Self-pay

## 2024-03-21 NOTE — Telephone Encounter (Signed)
 Pharmacy Patient Advocate Encounter  Received notification from [facility] that Prior Authorization for Tetracycline  HCl 500MG  capsules  has been APPROVED from 03/18/24 to 03/18/25. Unable to obtain price due to refill too soon rejection, last fill date 03/19/24 next available fill date08/20/25   PA #/Case ID/Reference #: [id number]

## 2024-03-21 NOTE — Telephone Encounter (Signed)
 I called and spoke with patient and notified her that Rx was approved through her insurance.

## 2024-03-29 MED ORDER — TALICIA 250-12.5-10 MG PO CPDR: 4.0000 | DELAYED_RELEASE_CAPSULE | Freq: Three times a day (TID) | ORAL | 0 refills | Status: DC

## 2024-03-30 ENCOUNTER — Telehealth: Payer: Self-pay

## 2024-03-30 NOTE — Telephone Encounter (Signed)
 Can we get a prior auth on Talica Capsules?

## 2024-03-31 ENCOUNTER — Other Ambulatory Visit (HOSPITAL_COMMUNITY): Payer: Self-pay

## 2024-03-31 NOTE — Telephone Encounter (Signed)
 I called and spoke with patient and notified her that prior auth was approved for Talicia .  I called [facility] pharmacy and spoke with pharmacy tech and she ran the Rx and it went through with a $4.00 co-pay.

## 2024-03-31 NOTE — Telephone Encounter (Signed)
 Hi Dr Nedra,                      We have received a Prior Authorization request for ''Talicia ''. However Medicaid Pref'd drug to be trialed first is Pylera. Please advise, if you agree on the change in medication with a new Rx.

## 2024-04-01 ENCOUNTER — Ambulatory Visit: Admitting: Nurse Practitioner

## 2024-04-01 ENCOUNTER — Encounter: Payer: Self-pay | Admitting: Nurse Practitioner

## 2024-04-01 VITALS — BP 108/70 | HR 86 | Temp 96.9°F | Ht 63.0 in | Wt 168.8 lb

## 2024-04-01 DIAGNOSIS — N898 Other specified noninflammatory disorders of vagina: Secondary | ICD-10-CM

## 2024-04-01 DIAGNOSIS — R82998 Other abnormal findings in urine: Secondary | ICD-10-CM | POA: Diagnosis not present

## 2024-04-01 DIAGNOSIS — M545 Low back pain, unspecified: Secondary | ICD-10-CM | POA: Diagnosis not present

## 2024-04-01 DIAGNOSIS — A048 Other specified bacterial intestinal infections: Secondary | ICD-10-CM

## 2024-04-01 DIAGNOSIS — N912 Amenorrhea, unspecified: Secondary | ICD-10-CM | POA: Diagnosis not present

## 2024-04-01 LAB — POCT URINALYSIS DIPSTICK
Bilirubin, UA: NEGATIVE
Glucose, UA: NEGATIVE
Ketones, UA: NEGATIVE
Nitrite, UA: NEGATIVE
Protein, UA: POSITIVE — AB
Spec Grav, UA: >=1.03 — AB (ref 1.010–1.025)
Urobilinogen, UA: 0.2 U/dL
pH, UA: 6 (ref 5.0–8.0)

## 2024-04-01 LAB — HCG, QUANTITATIVE, PREGNANCY: Quantitative HCG: 2.08 m[IU]/mL

## 2024-04-01 LAB — FSH/LH
FSH: 17.3 m[IU]/mL
LH: 54.8 m[IU]/mL

## 2024-04-01 LAB — TSH: TSH: 0.71 u[IU]/mL (ref 0.35–5.50)

## 2024-04-01 LAB — ESTRADIOL: Estradiol: 227 pg/mL

## 2024-04-01 MED ORDER — FLUCONAZOLE 150 MG PO TABS: 150.0000 mg | ORAL_TABLET | ORAL | 0 refills | Status: DC

## 2024-04-01 MED ORDER — ONDANSETRON HCL 4 MG PO TABS: 4.0000 mg | ORAL_TABLET | Freq: Three times a day (TID) | ORAL | 0 refills | Status: AC | PRN

## 2024-04-01 NOTE — Assessment & Plan Note (Signed)
 First course of antibiotics made her nauseous and vomiting and was only able to take for 4 days. Will trial Talicia  4 capsules TID. Zofran  sent in as needed for nausea.

## 2024-04-01 NOTE — Assessment & Plan Note (Addendum)
 She has not had a menstrual period since her laparoscopic salpingectomy 2 months ago. Will check beta HCG, LH, FSH, TSH, and estradiol  today. Discussed that this can happen and it takes some time for the ovaries to recover after surgery.

## 2024-04-01 NOTE — Patient Instructions (Signed)
 It was great to see you!  We are checking your labs today and will let you know the results via mychart/phone.   Take diflucan  one tablet once a week for 3 weeks   I have sent in nausea medicine to take as needed with the antibiotic for h pylori   You can use ice/heat/aleve as needed for back pain. Start the attached stretches daily   Let's follow-up if symptoms worsen or any concerns   Take care,  Tinnie Harada, NP

## 2024-04-01 NOTE — Progress Notes (Signed)
 Established Patient Office Visit  Subjective   Patient ID: Wanda Norton, female    DOB: 1986/05/07  Age: 38 y.o. MRN: [id number]  Chief Complaint  Patient presents with   Amenorrhea    Concerns with missed menstrual, back pain, wants to discuss having a pregnancy test-had surgery 02/08/24    HPI  Wanda Norton has missed her menses for the last 2 months. She states that this happened after her salpingectomy for birth control. She denies breast tenderness, and has had some nausea with recent antibiotics for h pylori. She has not taken a pregnancy test at home.   She also notes low back pain and vaginal itching. This started about 4 days ago. She has been taking aleve for her back pain. She notes clear vaginal discharge. She endorses urinary frequency, but denies fever and dysuria.     ROS See pertinent positives and negatives per HPI.    Objective:     BP 108/70 (BP Location: Left Arm, Patient Position: Sitting, Cuff Size: Normal)   Pulse 86   Temp (!) 96.9 F (36.1 C)   Ht 5' 3 (1.6 m)   Wt 168 lb 12.8 oz (76.6 kg)   LMP 01/12/2024 (Exact Date)   SpO2 99%   BMI 29.90 kg/m    Physical Exam Vitals and nursing note reviewed.  Constitutional:      General: She is not in acute distress.    Appearance: Normal appearance.  HENT:     Head: Normocephalic.  Eyes:     Conjunctiva/sclera: Conjunctivae normal.  Cardiovascular:     Rate and Rhythm: Normal rate and regular rhythm.     Pulses: Normal pulses.     Heart sounds: Normal heart sounds.  Pulmonary:     Effort: Pulmonary effort is normal.     Breath sounds: Normal breath sounds.  Abdominal:     Palpations: Abdomen is soft.     Tenderness: There is no abdominal tenderness.  Musculoskeletal:        General: Tenderness (midline lumbar spine) present. No swelling.     Cervical back: Normal range of motion.  Skin:    General: Skin is warm.  Neurological:     General: No focal deficit present.     Mental Status:  She is alert and oriented to person, place, and time.  Psychiatric:        Mood and Affect: Mood normal.        Behavior: Behavior normal.        Thought Content: Thought content normal.        Judgment: Judgment normal.      Assessment & Plan:   Problem List Items Addressed This Visit       Other   H. pylori infection   First course of antibiotics made her nauseous and vomiting and was only able to take for 4 days. Will trial Talicia  4 capsules TID. Zofran  sent in as needed for nausea.       Relevant Medications   fluconazole  (DIFLUCAN ) 150 MG tablet   Amenorrhea - Primary   She has not had a menstrual period since her laparoscopic salpingectomy 2 months ago. Will check beta HCG, LH, FSH, TSH, and estradiol  today. Discussed that this can happen and it takes some time for the ovaries to recover after surgery.       Relevant Orders   FSH/LH   Estradiol    TSH   B-HCG Quant   Other Visit Diagnoses  Vaginal itching       Most likely yeast infection with recent antibiotics. She will be on ABX for 2 more weeks for h pylori treatment. Start diflucan  150mg  weekly x3.     Acute midline low back pain without sciatica       U/A showed 3+ leukocytes. Will add on urine culture. Can continue aleve prn along with ice and heat. Stretches printed to do daily.   Relevant Orders   POCT urinalysis dipstick (Completed)     Leukocytes in urine       Add on urine culture. She is going to be on antibiotics for 2 weeks for H. pylori infection which may clear urine.   Relevant Orders   Urine Culture       Return if symptoms worsen or fail to improve.    Tinnie DELENA Harada, NP

## 2024-04-02 LAB — URINE CULTURE
MICRO NUMBER:: 16870684
Result:: NO GROWTH
SPECIMEN QUALITY:: ADEQUATE

## 2024-04-04 ENCOUNTER — Ambulatory Visit: Payer: Self-pay | Admitting: Nurse Practitioner

## 2024-04-18 NOTE — Addendum Note (Signed)
 Addended by: Tiffanie Blassingame A on: 04/18/2024 02:37 PM   Modules accepted: Orders

## 2024-09-08 ENCOUNTER — Ambulatory Visit (INDEPENDENT_AMBULATORY_CARE_PROVIDER_SITE_OTHER): Admitting: Nurse Practitioner

## 2024-09-08 ENCOUNTER — Encounter: Payer: Self-pay | Admitting: Nurse Practitioner

## 2024-09-08 VITALS — BP 110/74 | HR 77 | Temp 97.3°F | Ht 64.5 in | Wt 170.8 lb

## 2024-09-08 DIAGNOSIS — E538 Deficiency of other specified B group vitamins: Secondary | ICD-10-CM | POA: Diagnosis not present

## 2024-09-08 DIAGNOSIS — Z8619 Personal history of other infectious and parasitic diseases: Secondary | ICD-10-CM | POA: Diagnosis not present

## 2024-09-08 DIAGNOSIS — E7800 Pure hypercholesterolemia, unspecified: Secondary | ICD-10-CM

## 2024-09-08 DIAGNOSIS — E559 Vitamin D deficiency, unspecified: Secondary | ICD-10-CM

## 2024-09-08 DIAGNOSIS — R7303 Prediabetes: Secondary | ICD-10-CM

## 2024-09-08 DIAGNOSIS — L299 Pruritus, unspecified: Secondary | ICD-10-CM

## 2024-09-08 DIAGNOSIS — Z0000 Encounter for general adult medical examination without abnormal findings: Secondary | ICD-10-CM

## 2024-09-08 MED ORDER — LORATADINE 10 MG PO TABS: 10.0000 mg | ORAL_TABLET | Freq: Every day | ORAL | 3 refills | Status: AC

## 2024-09-08 NOTE — Assessment & Plan Note (Addendum)
Check vitamin B12 levels today and treat based on results.

## 2024-09-08 NOTE — Assessment & Plan Note (Addendum)
Chronic, stable. Check A1c today and treat based on results.

## 2024-09-08 NOTE — Assessment & Plan Note (Signed)
 Chronic, ongoing. Refilled claritin  10mg  daily. She has an upcoming appointment with dermatology.

## 2024-09-08 NOTE — Patient Instructions (Signed)
It was great to see you!  We are checking your labs today and will let you know the results via mychart/phone.   Keep up the great work!   Let's follow-up in 1 year, sooner if you have concerns.  If a referral was placed today, you will be contacted for an appointment. Please note that routine referrals can sometimes take up to 3-4 weeks to process. Please call our office if you haven't heard anything after this time frame.  Take care,  Nivin Braniff, NP  

## 2024-09-08 NOTE — Assessment & Plan Note (Deleted)
 Chronic, ongoing. She has tried camomile tea in addition to unisom and melatonin. She has had her nexplanon  removed. Follow-up if symptoms worsen or any concerns.

## 2024-09-08 NOTE — Progress Notes (Signed)
 "  BP 110/74 (BP Location: Left Arm, Patient Position: Sitting, Cuff Size: Normal)   Pulse 77   Temp (!) 97.3 F (36.3 C)   Ht 5' 4.5 (1.638 m)   Wt 170 lb 12.8 oz (77.5 kg)   LMP 08/30/2024 (Exact Date)   SpO2 97%   BMI 28.86 kg/m    Subjective:    Patient ID: Wanda Norton, female    DOB: 08/16/1985, 39 y.o.   MRN: [id number]  CC: Chief Complaint  Patient presents with   Annual Exam    With non-fasting labs, no concerns     HPI: Wanda Norton is a 39 y.o. female presenting on 09/08/2024 for comprehensive medical examination. Current medical complaints include:none  She currently lives with: kids Menopausal Symptoms: no  Depression and Anxiety Screen done today and results listed below:     09/08/2024    3:39 PM 12/22/2023   10:27 AM 07/13/2023    9:19 AM 03/10/2023    2:46 PM 09/22/2022    4:21 PM  Depression screen PHQ 2/9  Decreased Interest 0 2 0 0 0  Down, Depressed, Hopeless 0 0 1 0 0  PHQ - 2 Score 0 2 1 0 0  Altered sleeping 1 0 3    Tired, decreased energy 1 1 3     Change in appetite 1 1 1     Feeling bad or failure about yourself  0 0 1    Trouble concentrating 0 0 1    Moving slowly or fidgety/restless 0 0 0    Suicidal thoughts 0 0 0    PHQ-9 Score 3 4  10      Difficult doing work/chores Not difficult at all         Data saved with a previous flowsheet row definition      09/08/2024    3:40 PM 12/22/2023   10:27 AM 07/13/2023    9:19 AM 09/25/2021    9:26 AM  GAD 7 : Generalized Anxiety Score  Nervous, Anxious, on Edge 0 0  1  0   Control/stop worrying 1 1  1  1    Worry too much - different things 1 1  3  2    Trouble relaxing 0 1  1  0   Restless 0 0  1  0   Easily annoyed or irritable 2 1  3   0   Afraid - awful might happen 1 1  2   0   Total GAD 7 Score 5 5 12 3   Anxiety Difficulty Not difficult at all   Not difficult at all     Data saved with a previous flowsheet row definition    The patient does not have a history of falls. I did not  complete a risk assessment for falls. A plan of care for falls was not documented.   Past Medical History:  Past Medical History:  Diagnosis Date   Allergy    Headache    Upper respiratory tract infection 08/26/2023   Vitamin D  deficiency     Surgical History:  Past Surgical History:  Procedure Laterality Date   LAPAROSCOPIC BILATERAL SALPINGECTOMY Bilateral 02/08/2024   Procedure: SALPINGECTOMY, BILATERAL, LAPAROSCOPIC;  Surgeon: Jeralyn Crutch, MD;  Location: [facility] OR;  Service: Gynecology;  Laterality: Bilateral;   NO PAST SURGERIES      Medications:  Current Outpatient Medications on File Prior to Visit  Medication Sig   acetaminophen  (TYLENOL ) 325 MG tablet Take 2 tablets (650 mg total) by mouth every  4 (four) hours as needed (for pain scale < 4). (Patient not taking: Reported on 09/08/2024)   ibuprofen  (ADVIL ) 800 MG tablet Take 1 tablet (800 mg total) by mouth every 8 (eight) hours as needed (pain). Take with food. (Patient not taking: Reported on 09/08/2024)   ondansetron  (ZOFRAN ) 4 MG tablet Take 1 tablet (4 mg total) by mouth every 8 (eight) hours as needed. (Patient not taking: Reported on 09/08/2024)   No current facility-administered medications on file prior to visit.    Allergies:  Allergies[1]  Social History:  Social History   Socioeconomic History   Marital status: Married    Spouse name: Not on file   Number of children: Not on file   Years of education: Not on file   Highest education level: Not on file  Occupational History   Not on file  Tobacco Use   Smoking status: Never   Smokeless tobacco: Never  Vaping Use   Vaping status: Never Used  Substance and Sexual Activity   Alcohol use: No   Drug use: No   Sexual activity: Yes    Birth control/protection: None    Comment: Wants to get a tubal  Other Topics Concern   Not on file  Social History Narrative   Not on file   Social Drivers of Health   Tobacco Use: Low Risk (09/08/2024)   Patient  History    Smoking Tobacco Use: Never    Smokeless Tobacco Use: Never    Passive Exposure: Not on file  Financial Resource Strain: Not on file  Food Insecurity: Not on file  Transportation Needs: Not on file  Physical Activity: Not on file  Stress: Not on file  Social Connections: Not on file  Intimate Partner Violence: Not on file  Depression (PHQ2-9): Low Risk (09/08/2024)   Depression (PHQ2-9)    PHQ-2 Score: 3  Alcohol Screen: Not on file  Housing: Not on file  Utilities: Not on file  Health Literacy: Not on file   Tobacco Use History[2] Social History   Substance and Sexual Activity  Alcohol Use No    Family History:  Family History  Problem Relation Age of Onset   Alcohol abuse Neg Hx    Arthritis Neg Hx    Asthma Neg Hx    Birth defects Neg Hx    Cancer Neg Hx    COPD Neg Hx    Depression Neg Hx    Diabetes Neg Hx    Drug abuse Neg Hx    Early death Neg Hx    Hearing loss Neg Hx    Heart disease Neg Hx    Hyperlipidemia Neg Hx    Hypertension Neg Hx    Kidney disease Neg Hx    Learning disabilities Neg Hx    Mental illness Neg Hx    Mental retardation Neg Hx    Miscarriages / Stillbirths Neg Hx    Stroke Neg Hx    Vision loss Neg Hx    Varicose Veins Neg Hx    Allergic rhinitis Neg Hx    Eczema Neg Hx    Urticaria Neg Hx     Past medical history, surgical history, medications, allergies, family history and social history reviewed with patient today and changes made to appropriate areas of the chart.   Review of Systems  Constitutional: Negative.   HENT: Negative.    Eyes: Negative.   Respiratory: Negative.    Cardiovascular: Negative.   Gastrointestinal: Negative.   Genitourinary: Negative.  Musculoskeletal: Negative.   Skin: Negative.   Neurological: Negative.   Psychiatric/Behavioral: Negative.     All other ROS negative except what is listed above and in the HPI.      Objective:    BP 110/74 (BP Location: Left Arm, Patient  Position: Sitting, Cuff Size: Normal)   Pulse 77   Temp (!) 97.3 F (36.3 C)   Ht 5' 4.5 (1.638 m)   Wt 170 lb 12.8 oz (77.5 kg)   LMP 08/30/2024 (Exact Date)   SpO2 97%   BMI 28.86 kg/m   Wt Readings from Last 3 Encounters:  09/08/24 170 lb 12.8 oz (77.5 kg)  04/01/24 168 lb 12.8 oz (76.6 kg)  03/16/24 170 lb 3.2 oz (77.2 kg)    Physical Exam Vitals and nursing note reviewed.  Constitutional:      General: She is not in acute distress.    Appearance: Normal appearance.  HENT:     Head: Normocephalic and atraumatic.     Right Ear: Tympanic membrane, ear canal and external ear normal.     Left Ear: Tympanic membrane, ear canal and external ear normal.     Mouth/Throat:     Mouth: Mucous membranes are moist.     Pharynx: No posterior oropharyngeal erythema.  Eyes:     Conjunctiva/sclera: Conjunctivae normal.  Cardiovascular:     Rate and Rhythm: Normal rate and regular rhythm.     Pulses: Normal pulses.     Heart sounds: Normal heart sounds.  Pulmonary:     Effort: Pulmonary effort is normal.     Breath sounds: Normal breath sounds.  Abdominal:     Palpations: Abdomen is soft.     Tenderness: There is no abdominal tenderness.  Musculoskeletal:        General: Normal range of motion.     Cervical back: Normal range of motion and neck supple.     Right lower leg: No edema.     Left lower leg: No edema.  Lymphadenopathy:     Cervical: No cervical adenopathy.  Skin:    General: Skin is warm and dry.  Neurological:     General: No focal deficit present.     Mental Status: She is alert and oriented to person, place, and time.     Cranial Nerves: No cranial nerve deficit.     Coordination: Coordination normal.     Gait: Gait normal.  Psychiatric:        Mood and Affect: Mood normal.        Behavior: Behavior normal.        Thought Content: Thought content normal.        Judgment: Judgment normal.     Results for orders placed or performed in visit on 04/01/24   FSH/LH   Collection Time: 04/01/24 11:49 AM  Result Value Ref Range   FSH 17.3 mIU/mL   LH 54.8 mIU/mL  Estradiol    Collection Time: 04/01/24 11:49 AM  Result Value Ref Range   Estradiol  227 pg/mL  TSH   Collection Time: 04/01/24 11:49 AM  Result Value Ref Range   TSH 0.71 0.35 - 5.50 uIU/mL  B-HCG Quant   Collection Time: 04/01/24 11:49 AM  Result Value Ref Range   Quantitative HCG 2.08 mIU/ml  Urine Culture   Collection Time: 04/01/24 12:00 PM   Specimen: Urine  Result Value Ref Range   MICRO NUMBER: [id number]    SPECIMEN QUALITY: Adequate    Sample Source URINE  STATUS: FINAL    Result: No Growth   POCT urinalysis dipstick   Collection Time: 04/01/24 12:00 PM  Result Value Ref Range   Color, UA     Clarity, UA     Glucose, UA Negative Negative   Bilirubin, UA negative    Ketones, UA negative    Spec Grav, UA >=1.030 (A) 1.010 - 1.025   Blood, UA +    pH, UA 6.0 5.0 - 8.0   Protein, UA Positive (A) Negative   Urobilinogen, UA 0.2 0.2 or 1.0 E.U./dL   Nitrite, UA negative    Leukocytes, UA Large (3+) (A) Negative   Appearance     Odor        Assessment & Plan:   Problem List Items Addressed This Visit       Musculoskeletal and Integument   Itchy skin   Chronic, ongoing. Refilled claritin  10mg  daily. She has an upcoming appointment with dermatology.         Other   Vitamin D  deficiency   Chronic, stable.  Continue vitamin D  supplement daily. Check vitamin D  levels today and adjust regimen based on results.       Relevant Orders   VITAMIN D  25 Hydroxy (Vit-D Deficiency, Fractures)   Prediabetes   Chronic, stable. Check A1c today and treat based on results.       Relevant Orders   Hemoglobin A1c   TSH   Pure hypercholesterolemia   Chronic, stable.  Lipid panel has improved since changing her diet.  Continue with nutritional changes and slowly increase exercise as able. Check CMP, CBC, lipid panel today.       Relevant Orders   CBC with  Differential/Platelet   Comprehensive metabolic panel with GFR   Lipid panel   Routine general medical examination at a health care facility - Primary   Health maintenance reviewed and updated. Discussed nutrition, exercise. Follow-up 1 year.       B12 deficiency   Check vitamin B12 levels today and treat based on results.       Relevant Orders   Vitamin B12   Other Visit Diagnoses       History of Helicobacter pylori infection       She finished antibiotic treatment. Check h pylori today, if positive refer to GI.   Relevant Orders   H. pylori breath test         Follow up plan: Return in about 1 year (around 09/08/2025) for CPE.   LABORATORY TESTING:  - Pap smear: Up to date  IMMUNIZATIONS:   - Tdap: Tetanus vaccination status reviewed: last tetanus booster within 10 years. - Influenza: Declined - Pneumovax: Not applicable - Prevnar: Not applicable - HPV: Up to date - Shingrix vaccine: Not applicable  SCREENING: -Mammogram: Not applicable  - Colonoscopy: Not applicable  - Bone Density: Not applicable   PATIENT COUNSELING:   Advised to take 1 mg of folate supplement per day if capable of pregnancy.   Sexuality: Discussed sexually transmitted diseases, partner selection, use of condoms, avoidance of unintended pregnancy  and contraceptive alternatives.   Advised to avoid cigarette smoking.  I discussed with the patient that most people either abstain from alcohol or drink within safe limits (<=14/week and <=4 drinks/occasion for males, <=7/weeks and <= 3 drinks/occasion for females) and that the risk for alcohol disorders and other health effects rises proportionally with the number of drinks per week and how often a drinker exceeds daily limits.  Discussed cessation/primary  prevention of drug use and availability of treatment for abuse.   Diet: Encouraged to adjust caloric intake to maintain  or achieve ideal body weight, to reduce intake of dietary saturated  fat and total fat, to limit sodium intake by avoiding high sodium foods and not adding table salt, and to maintain adequate dietary potassium and calcium preferably from fresh fruits, vegetables, and low-fat dairy products.    stressed the importance of regular exercise  Injury prevention: Discussed safety belts, safety helmets, smoke detector, smoking near bedding or upholstery.   Dental health: Discussed importance of regular tooth brushing, flossing, and dental visits.    NEXT PREVENTATIVE PHYSICAL DUE IN 1 YEAR. Return in about 1 year (around 09/08/2025) for CPE.  Tinnie DELENA Harada, NP  I,Emily Lagle,acting as a scribe for [employer], NP.,have documented all relevant documentation on the behalf of Nigel Ericsson DELENA Harada, NP.  I, Tinnie DELENA Harada, NP, have reviewed all documentation for this visit. The documentation on 09/08/2024 for the exam, diagnosis, procedures, and orders are all accurate and complete.     [1] No Known Allergies [2]  Social History Tobacco Use  Smoking Status Never  Smokeless Tobacco Never   "

## 2024-09-08 NOTE — Assessment & Plan Note (Addendum)
Chronic, stable. Continue vitamin D supplement daily.  Check vitamin D levels today and adjust regimen based on results

## 2024-09-08 NOTE — Assessment & Plan Note (Signed)
 Chronic, stable.  Lipid panel has improved since changing her diet.  Continue with nutritional changes and slowly increase exercise as able. Check CMP, CBC, lipid panel today.

## 2024-09-08 NOTE — Assessment & Plan Note (Addendum)
 Health maintenance reviewed and updated. Discussed nutrition, exercise. Follow-up 1 year.

## 2024-09-09 ENCOUNTER — Ambulatory Visit: Payer: Self-pay | Admitting: Nurse Practitioner

## 2024-09-09 LAB — TSH: TSH: 0.99 u[IU]/mL (ref 0.35–5.50)

## 2024-09-09 LAB — COMPREHENSIVE METABOLIC PANEL WITH GFR
ALT: 9 U/L (ref 3–35)
AST: 16 U/L (ref 5–37)
Albumin: 4 g/dL (ref 3.5–5.2)
Alkaline Phosphatase: 50 U/L (ref 39–117)
BUN: 12 mg/dL (ref 6–23)
CO2: 28 meq/L (ref 19–32)
Calcium: 9.2 mg/dL (ref 8.4–10.5)
Chloride: 103 meq/L (ref 96–112)
Creatinine, Ser: 0.4 mg/dL (ref 0.40–1.20)
GFR: 125.03 mL/min
Glucose, Bld: 98 mg/dL (ref 70–99)
Potassium: 3.7 meq/L (ref 3.5–5.1)
Sodium: 138 meq/L (ref 135–145)
Total Bilirubin: 0.3 mg/dL (ref 0.2–1.2)
Total Protein: 6.7 g/dL (ref 6.0–8.3)

## 2024-09-09 LAB — VITAMIN B12: Vitamin B-12: 406 pg/mL (ref 211–911)

## 2024-09-09 LAB — CBC WITH DIFFERENTIAL/PLATELET
Basophils Absolute: 0.1 10*3/uL (ref 0.0–0.1)
Basophils Relative: 1.4 % (ref 0.0–3.0)
Eosinophils Absolute: 0.1 10*3/uL (ref 0.0–0.7)
Eosinophils Relative: 1.6 % (ref 0.0–5.0)
HCT: 36.2 % (ref 36.0–46.0)
Hemoglobin: 12 g/dL (ref 12.0–15.0)
Lymphocytes Relative: 30.3 % (ref 12.0–46.0)
Lymphs Abs: 1.6 10*3/uL (ref 0.7–4.0)
MCHC: 33.2 g/dL (ref 30.0–36.0)
MCV: 86.4 fl (ref 78.0–100.0)
Monocytes Absolute: 0.3 10*3/uL (ref 0.1–1.0)
Monocytes Relative: 6.3 % (ref 3.0–12.0)
Neutro Abs: 3.3 10*3/uL (ref 1.4–7.7)
Neutrophils Relative %: 60.4 % (ref 43.0–77.0)
Platelets: 254 10*3/uL (ref 150.0–400.0)
RBC: 4.18 Mil/uL (ref 3.87–5.11)
RDW: 14 % (ref 11.5–15.5)
WBC: 5.4 10*3/uL (ref 4.0–10.5)

## 2024-09-09 LAB — H. PYLORI BREATH TEST: H. pylori Breath Test: NOT DETECTED

## 2024-09-09 LAB — HEMOGLOBIN A1C: Hgb A1c MFr Bld: 5.9 % (ref 4.6–6.5)

## 2024-09-09 LAB — LIPID PANEL
Cholesterol: 154 mg/dL (ref 28–200)
HDL: 41.1 mg/dL
LDL Cholesterol: 75 mg/dL (ref 10–99)
NonHDL: 112.42
Total CHOL/HDL Ratio: 4
Triglycerides: 185 mg/dL — ABNORMAL HIGH (ref 10.0–149.0)
VLDL: 37 mg/dL (ref 0.0–40.0)

## 2024-09-09 LAB — VITAMIN D 25 HYDROXY (VIT D DEFICIENCY, FRACTURES): VITD: 42.23 ng/mL (ref 30.00–100.00)

## 2024-10-26 ENCOUNTER — Ambulatory Visit: Admitting: Dermatology
